# Patient Record
Sex: Male | Born: 1970 | Race: Black or African American | Hispanic: No | Marital: Single | Smoking: Former smoker
Health system: Southern US, Community
[De-identification: ages and names within clinical notes are randomized; demographics above are authoritative.]

## PROBLEM LIST (undated history)

## (undated) DIAGNOSIS — K219 Gastro-esophageal reflux disease without esophagitis: Secondary | ICD-10-CM

## (undated) DIAGNOSIS — Z889 Allergy status to unspecified drugs, medicaments and biological substances status: Secondary | ICD-10-CM

## (undated) DIAGNOSIS — F419 Anxiety disorder, unspecified: Secondary | ICD-10-CM

## (undated) DIAGNOSIS — F329 Major depressive disorder, single episode, unspecified: Secondary | ICD-10-CM

## (undated) DIAGNOSIS — E785 Hyperlipidemia, unspecified: Secondary | ICD-10-CM

## (undated) DIAGNOSIS — F32A Depression, unspecified: Secondary | ICD-10-CM

## (undated) DIAGNOSIS — I10 Essential (primary) hypertension: Secondary | ICD-10-CM

## (undated) HISTORY — DX: Major depressive disorder, single episode, unspecified: F32.9

## (undated) HISTORY — PX: TIBIA FRACTURE SURGERY: SHX806

## (undated) HISTORY — DX: Gastro-esophageal reflux disease without esophagitis: K21.9

## (undated) HISTORY — DX: Hyperlipidemia, unspecified: E78.5

## (undated) HISTORY — DX: Allergy status to unspecified drugs, medicaments and biological substances: Z88.9

## (undated) HISTORY — DX: Essential (primary) hypertension: I10

## (undated) HISTORY — DX: Depression, unspecified: F32.A

## (undated) HISTORY — DX: Anxiety disorder, unspecified: F41.9

---

## 1998-09-19 ENCOUNTER — Encounter: Admission: RE | Admit: 1998-09-19 | Discharge: 1998-09-19 | Payer: Self-pay | Admitting: *Deleted

## 2001-10-23 ENCOUNTER — Encounter: Payer: Self-pay | Admitting: Family Medicine

## 2001-10-23 ENCOUNTER — Ambulatory Visit (HOSPITAL_COMMUNITY): Admission: RE | Admit: 2001-10-23 | Discharge: 2001-10-23 | Payer: Self-pay | Admitting: Family Medicine

## 2002-07-14 ENCOUNTER — Emergency Department (HOSPITAL_COMMUNITY): Admission: EM | Admit: 2002-07-14 | Discharge: 2002-07-14 | Payer: Self-pay | Admitting: Emergency Medicine

## 2002-07-18 ENCOUNTER — Emergency Department (HOSPITAL_COMMUNITY): Admission: EM | Admit: 2002-07-18 | Discharge: 2002-07-18 | Payer: Self-pay | Admitting: Emergency Medicine

## 2003-12-28 ENCOUNTER — Inpatient Hospital Stay (HOSPITAL_COMMUNITY): Admission: AC | Admit: 2003-12-28 | Discharge: 2004-01-03 | Payer: Self-pay

## 2004-01-04 ENCOUNTER — Emergency Department (HOSPITAL_COMMUNITY): Admission: EM | Admit: 2004-01-04 | Discharge: 2004-01-05 | Payer: Self-pay | Admitting: Emergency Medicine

## 2004-02-27 ENCOUNTER — Encounter: Admission: RE | Admit: 2004-02-27 | Discharge: 2004-05-27 | Payer: Self-pay | Admitting: *Deleted

## 2004-05-29 ENCOUNTER — Encounter: Admission: RE | Admit: 2004-05-29 | Discharge: 2004-05-29 | Payer: Self-pay | Admitting: Neurology

## 2004-08-13 ENCOUNTER — Emergency Department (HOSPITAL_COMMUNITY): Admission: EM | Admit: 2004-08-13 | Discharge: 2004-08-13 | Payer: Self-pay | Admitting: Emergency Medicine

## 2011-07-21 ENCOUNTER — Other Ambulatory Visit: Payer: Self-pay | Admitting: Orthopedic Surgery

## 2011-07-21 ENCOUNTER — Encounter (HOSPITAL_COMMUNITY): Payer: 59

## 2011-07-21 LAB — URINALYSIS, ROUTINE W REFLEX MICROSCOPIC
Bilirubin Urine: NEGATIVE
Glucose, UA: NEGATIVE mg/dL
Hgb urine dipstick: NEGATIVE
Ketones, ur: NEGATIVE mg/dL
Leukocytes, UA: NEGATIVE
Nitrite: NEGATIVE
Protein, ur: NEGATIVE mg/dL
Specific Gravity, Urine: 1.024 (ref 1.005–1.030)
Urobilinogen, UA: 1 mg/dL (ref 0.0–1.0)
pH: 5.5 (ref 5.0–8.0)

## 2011-07-21 LAB — CBC
HCT: 43 % (ref 39.0–52.0)
Hemoglobin: 14.5 g/dL (ref 13.0–17.0)
MCH: 29.5 pg (ref 26.0–34.0)
MCHC: 33.7 g/dL (ref 30.0–36.0)
MCV: 87.6 fL (ref 78.0–100.0)
Platelets: 275 10*3/uL (ref 150–400)
RBC: 4.91 MIL/uL (ref 4.22–5.81)
RDW: 13.4 % (ref 11.5–15.5)
WBC: 5.4 10*3/uL (ref 4.0–10.5)

## 2011-07-21 LAB — PROTIME-INR
INR: 0.99 (ref 0.00–1.49)
Prothrombin Time: 13.3 seconds (ref 11.6–15.2)

## 2011-07-21 LAB — BASIC METABOLIC PANEL
BUN: 11 mg/dL (ref 6–23)
Calcium: 10 mg/dL (ref 8.4–10.5)
Chloride: 107 mEq/L (ref 96–112)
Creatinine, Ser: 0.83 mg/dL (ref 0.50–1.35)
GFR calc Af Amer: >60 mL/min (ref >60)
GFR calc non Af Amer: >60 mL/min (ref >60)
Glucose, Bld: 83 mg/dL (ref 70–99)
Potassium: 3.7 mEq/L (ref 3.5–5.1)
Sodium: 143 mEq/L (ref 135–145)

## 2011-07-21 LAB — DIFFERENTIAL
Basophils Absolute: 0 10*3/uL (ref 0.0–0.1)
Basophils Relative: 1 % (ref 0–1)
Eosinophils Absolute: 0.1 10*3/uL (ref 0.0–0.7)
Eosinophils Relative: 2 % (ref 0–5)
Lymphocytes Relative: 41 % (ref 12–46)
Lymphs Abs: 2.2 10*3/uL (ref 0.7–4.0)
Monocytes Absolute: 0.7 10*3/uL (ref 0.1–1.0)
Monocytes Relative: 12 % (ref 3–12)
Neutro Abs: 2.4 10*3/uL (ref 1.7–7.7)
Neutrophils Relative %: 44 % (ref 43–77)

## 2011-07-21 LAB — SURGICAL PCR SCREEN
MRSA, PCR: INVALID — AB
Staphylococcus aureus: INVALID — AB

## 2011-07-21 LAB — APTT: aPTT: 27 seconds (ref 24–37)

## 2011-07-24 LAB — MRSA CULTURE

## 2011-07-26 ENCOUNTER — Inpatient Hospital Stay (HOSPITAL_COMMUNITY): Payer: 59

## 2011-07-26 ENCOUNTER — Observation Stay (HOSPITAL_COMMUNITY)
Admission: RE | Admit: 2011-07-26 | Discharge: 2011-07-27 | Disposition: A | Discharge status: Home / Self Care | Payer: 59 | Source: Ambulatory Visit | Attending: Orthopedic Surgery | Admitting: Orthopedic Surgery

## 2011-07-26 DIAGNOSIS — Y838 Other surgical procedures as the cause of abnormal reaction of the patient, or of later complication, without mention of misadventure at the time of the procedure: Secondary | ICD-10-CM | POA: Insufficient documentation

## 2011-07-26 DIAGNOSIS — T8489XA Other specified complication of internal orthopedic prosthetic devices, implants and grafts, initial encounter: Principal | ICD-10-CM | POA: Insufficient documentation

## 2011-07-26 DIAGNOSIS — Z01812 Encounter for preprocedural laboratory examination: Secondary | ICD-10-CM | POA: Insufficient documentation

## 2011-07-26 DIAGNOSIS — M25569 Pain in unspecified knee: Secondary | ICD-10-CM | POA: Insufficient documentation

## 2011-07-26 DIAGNOSIS — E785 Hyperlipidemia, unspecified: Secondary | ICD-10-CM | POA: Insufficient documentation

## 2011-07-26 DIAGNOSIS — Z79899 Other long term (current) drug therapy: Secondary | ICD-10-CM | POA: Insufficient documentation

## 2011-07-26 DIAGNOSIS — K219 Gastro-esophageal reflux disease without esophagitis: Secondary | ICD-10-CM | POA: Insufficient documentation
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
## 2011-11-03 ENCOUNTER — Other Ambulatory Visit: Payer: Self-pay | Admitting: Family Medicine

## 2011-11-03 DIAGNOSIS — R51 Headache: Secondary | ICD-10-CM

## 2011-11-08 ENCOUNTER — Encounter: Payer: Self-pay | Admitting: Neurology

## 2011-11-09 ENCOUNTER — Ambulatory Visit
Admission: RE | Admit: 2011-11-09 | Discharge: 2011-11-09 | Disposition: A | Discharge status: Home / Self Care | Payer: 59 | Source: Ambulatory Visit | Attending: Family Medicine | Admitting: Family Medicine

## 2011-11-09 DIAGNOSIS — R51 Headache: Secondary | ICD-10-CM

## 2011-12-01 ENCOUNTER — Ambulatory Visit (INDEPENDENT_AMBULATORY_CARE_PROVIDER_SITE_OTHER): Payer: 59 | Admitting: Neurology

## 2011-12-01 ENCOUNTER — Encounter: Payer: Self-pay | Admitting: Neurology

## 2011-12-01 VITALS — BP 122/80 | HR 60 | Wt 200.0 lb

## 2011-12-01 DIAGNOSIS — G43919 Migraine, unspecified, intractable, without status migrainosus: Secondary | ICD-10-CM

## 2011-12-01 MED ORDER — TOPIRAMATE 100 MG PO TABS: ORAL_TABLET | ORAL | Status: DC

## 2011-12-01 MED ORDER — RIZATRIPTAN BENZOATE 10 MG PO TABS: 10.0000 mg | ORAL_TABLET | Freq: Once | ORAL | Status: DC | PRN

## 2011-12-01 MED ORDER — DEXAMETHASONE 2 MG PO TABS: ORAL_TABLET | ORAL | Status: DC
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
## 2011-12-01 NOTE — Patient Instructions (Signed)
Your prescription has been sent to your pharmacy.  We will see you back in 6 weeks.

## 2012-01-12 ENCOUNTER — Ambulatory Visit: Payer: 59 | Admitting: Neurology

## 2013-11-05 ENCOUNTER — Other Ambulatory Visit: Payer: Self-pay | Admitting: Orthopedic Surgery

## 2013-11-14 ENCOUNTER — Encounter (HOSPITAL_BASED_OUTPATIENT_CLINIC_OR_DEPARTMENT_OTHER): Payer: Self-pay | Admitting: *Deleted

## 2013-11-19 ENCOUNTER — Encounter (HOSPITAL_BASED_OUTPATIENT_CLINIC_OR_DEPARTMENT_OTHER): Payer: Self-pay | Admitting: Anesthesiology

## 2013-11-19 ENCOUNTER — Encounter (HOSPITAL_BASED_OUTPATIENT_CLINIC_OR_DEPARTMENT_OTHER): Payer: 59 | Admitting: Anesthesiology

## 2013-11-19 ENCOUNTER — Encounter (HOSPITAL_BASED_OUTPATIENT_CLINIC_OR_DEPARTMENT_OTHER)
Admission: RE | Disposition: A | Discharge status: Home / Self Care | Payer: Self-pay | Source: Ambulatory Visit | Attending: Orthopedic Surgery

## 2013-11-19 ENCOUNTER — Ambulatory Visit (HOSPITAL_BASED_OUTPATIENT_CLINIC_OR_DEPARTMENT_OTHER)
Admission: RE | Admit: 2013-11-19 | Discharge: 2013-11-19 | Disposition: A | Discharge status: Home / Self Care | Payer: 59 | Source: Ambulatory Visit | Attending: Orthopedic Surgery | Admitting: Orthopedic Surgery

## 2013-11-19 ENCOUNTER — Ambulatory Visit (HOSPITAL_BASED_OUTPATIENT_CLINIC_OR_DEPARTMENT_OTHER): Payer: 59 | Admitting: Anesthesiology

## 2013-11-19 DIAGNOSIS — Z87891 Personal history of nicotine dependence: Secondary | ICD-10-CM | POA: Insufficient documentation

## 2013-11-19 DIAGNOSIS — M75101 Unspecified rotator cuff tear or rupture of right shoulder, not specified as traumatic: Secondary | ICD-10-CM

## 2013-11-19 DIAGNOSIS — M67919 Unspecified disorder of synovium and tendon, unspecified shoulder: Secondary | ICD-10-CM | POA: Insufficient documentation

## 2013-11-19 DIAGNOSIS — E785 Hyperlipidemia, unspecified: Secondary | ICD-10-CM | POA: Insufficient documentation

## 2013-11-19 DIAGNOSIS — F411 Generalized anxiety disorder: Secondary | ICD-10-CM | POA: Insufficient documentation

## 2013-11-19 DIAGNOSIS — F329 Major depressive disorder, single episode, unspecified: Secondary | ICD-10-CM | POA: Insufficient documentation

## 2013-11-19 DIAGNOSIS — I10 Essential (primary) hypertension: Secondary | ICD-10-CM | POA: Insufficient documentation

## 2013-11-19 DIAGNOSIS — M24119 Other articular cartilage disorders, unspecified shoulder: Secondary | ICD-10-CM | POA: Insufficient documentation

## 2013-11-19 DIAGNOSIS — M719 Bursopathy, unspecified: Secondary | ICD-10-CM | POA: Insufficient documentation

## 2013-11-19 DIAGNOSIS — M25819 Other specified joint disorders, unspecified shoulder: Secondary | ICD-10-CM | POA: Insufficient documentation

## 2013-11-19 DIAGNOSIS — F3289 Other specified depressive episodes: Secondary | ICD-10-CM | POA: Insufficient documentation

## 2013-11-19 DIAGNOSIS — K219 Gastro-esophageal reflux disease without esophagitis: Secondary | ICD-10-CM | POA: Insufficient documentation

## 2013-11-19 DIAGNOSIS — Z79899 Other long term (current) drug therapy: Secondary | ICD-10-CM | POA: Insufficient documentation

## 2013-11-19 HISTORY — PX: SHOULDER ARTHROSCOPY WITH ROTATOR CUFF REPAIR AND SUBACROMIAL DECOMPRESSION: SHX5686

## 2013-11-19 LAB — POCT HEMOGLOBIN-HEMACUE: Hemoglobin: 15.5 g/dL (ref 13.0–17.0)

## 2013-11-19 SURGERY — SHOULDER ARTHROSCOPY WITH ROTATOR CUFF REPAIR AND SUBACROMIAL DECOMPRESSION
Anesthesia: Regional | Site: Shoulder | Laterality: Right

## 2013-11-19 MED ORDER — BUPIVACAINE-EPINEPHRINE PF 0.5-1:200000 % IJ SOLN
INTRAMUSCULAR | Status: DC | PRN
  Administered 2013-11-19: 30 mL via PERINEURAL

## 2013-11-19 MED ORDER — POVIDONE-IODINE 7.5 % EX SOLN: Freq: Once | CUTANEOUS | Status: DC

## 2013-11-19 MED ORDER — CEFAZOLIN SODIUM-DEXTROSE 2-3 GM-% IV SOLR
2.0000 g | INTRAVENOUS | Status: AC
  Administered 2013-11-19: 2 g via INTRAVENOUS

## 2013-11-19 MED ORDER — DOCUSATE SODIUM 100 MG PO CAPS: 100.0000 mg | ORAL_CAPSULE | Freq: Three times a day (TID) | ORAL | Status: DC | PRN

## 2013-11-19 MED ORDER — ONDANSETRON HCL 4 MG/2ML IJ SOLN
INTRAMUSCULAR | Status: DC | PRN
  Administered 2013-11-19: 4 mg via INTRAVENOUS

## 2013-11-19 MED ORDER — FENTANYL CITRATE 0.05 MG/ML IJ SOLN
50.0000 ug | INTRAMUSCULAR | Status: DC | PRN
  Administered 2013-11-19: 100 ug via INTRAVENOUS

## 2013-11-19 MED ORDER — LACTATED RINGERS IV SOLN
INTRAVENOUS | Status: DC
  Administered 2013-11-19 (×2): via INTRAVENOUS

## 2013-11-19 MED ORDER — MIDAZOLAM HCL 2 MG/2ML IJ SOLN
1.0000 mg | INTRAMUSCULAR | Status: DC | PRN
  Administered 2013-11-19: 2 mg via INTRAVENOUS

## 2013-11-19 MED ORDER — OXYCODONE-ACETAMINOPHEN 5-325 MG PO TABS: 1.0000 | ORAL_TABLET | ORAL | Status: DC | PRN

## 2013-11-19 MED ORDER — OXYCODONE HCL 5 MG/5ML PO SOLN: 5.0000 mg | Freq: Once | ORAL | Status: DC | PRN

## 2013-11-19 MED ORDER — EPHEDRINE SULFATE 50 MG/ML IJ SOLN
INTRAMUSCULAR | Status: DC | PRN
  Administered 2013-11-19 (×3): 10 mg via INTRAVENOUS

## 2013-11-19 MED ORDER — HYDROMORPHONE HCL PF 1 MG/ML IJ SOLN: 0.2500 mg | INTRAMUSCULAR | Status: DC | PRN

## 2013-11-19 MED ORDER — LIDOCAINE HCL (CARDIAC) 20 MG/ML IV SOLN
INTRAVENOUS | Status: DC | PRN
  Administered 2013-11-19: 40 mg via INTRAVENOUS

## 2013-11-19 MED ORDER — MIDAZOLAM HCL 2 MG/2ML IJ SOLN
INTRAMUSCULAR | Status: AC
  Filled 2013-11-19: qty 2

## 2013-11-19 MED ORDER — FENTANYL CITRATE 0.05 MG/ML IJ SOLN
INTRAMUSCULAR | Status: AC
  Filled 2013-11-19: qty 2

## 2013-11-19 MED ORDER — LIDOCAINE HCL 4 % MT SOLN
OROMUCOSAL | Status: DC | PRN
  Administered 2013-11-19: 2 mL via TOPICAL

## 2013-11-19 MED ORDER — DEXAMETHASONE SODIUM PHOSPHATE 4 MG/ML IJ SOLN
INTRAMUSCULAR | Status: DC | PRN
  Administered 2013-11-19: 10 mg via INTRAVENOUS

## 2013-11-19 MED ORDER — SUCCINYLCHOLINE CHLORIDE 20 MG/ML IJ SOLN
INTRAMUSCULAR | Status: DC | PRN
  Administered 2013-11-19: 100 mg via INTRAVENOUS

## 2013-11-19 MED ORDER — OXYCODONE HCL 5 MG PO TABS: 5.0000 mg | ORAL_TABLET | Freq: Once | ORAL | Status: DC | PRN

## 2013-11-19 MED ORDER — PROPOFOL 10 MG/ML IV BOLUS
INTRAVENOUS | Status: DC | PRN
  Administered 2013-11-19: 150 mg via INTRAVENOUS
  Administered 2013-11-19: 50 mg via INTRAVENOUS

## 2013-11-19 MED ORDER — CEFAZOLIN SODIUM-DEXTROSE 2-3 GM-% IV SOLR
INTRAVENOUS | Status: AC
  Filled 2013-11-19: qty 50

## 2013-11-19 SURGICAL SUPPLY — 87 items
ADH SKN CLS APL DERMABOND .7 (GAUZE/BANDAGES/DRESSINGS)
ANCH SUT 2 FT CRKSW 14.7X5.5 (Anchor) ×1 IMPLANT
ANCH SUT PUSHLCK 24X4.5 STRL (Orthopedic Implant) ×1 IMPLANT
ANCHOR CORKSCREW FIBER 5.5X15 (Anchor) ×1 IMPLANT
APL SKNCLS STERI-STRIP NONHPOA (GAUZE/BANDAGES/DRESSINGS)
BENZOIN TINCTURE PRP APPL 2/3 (GAUZE/BANDAGES/DRESSINGS) IMPLANT
BLADE SURG 15 STRL LF DISP TIS (BLADE) IMPLANT
BLADE SURG 15 STRL SS (BLADE)
BLADE SURG ROTATE 9660 (MISCELLANEOUS) IMPLANT
BUR OVAL 4.0 (BURR) IMPLANT
BUR OVAL 6.0 (BURR) ×2 IMPLANT
CANISTER SUCT 3000ML (MISCELLANEOUS) IMPLANT
CANISTER SUCT LVC 12 LTR MEDI- (MISCELLANEOUS) ×2 IMPLANT
CANNULA 5.75X71 LONG (CANNULA) ×2 IMPLANT
CANNULA TWIST IN 8.25X7CM (CANNULA) ×1 IMPLANT
CHLORAPREP W/TINT 26ML (MISCELLANEOUS) ×2 IMPLANT
DECANTER SPIKE VIAL GLASS SM (MISCELLANEOUS) IMPLANT
DERMABOND ADVANCED (GAUZE/BANDAGES/DRESSINGS)
DERMABOND ADVANCED .7 DNX12 (GAUZE/BANDAGES/DRESSINGS) IMPLANT
DRAPE INCISE IOBAN 66X45 STRL (DRAPES) ×2 IMPLANT
DRAPE STERI 35X30 U-POUCH (DRAPES) ×2 IMPLANT
DRAPE SURG 17X23 STRL (DRAPES) ×2 IMPLANT
DRAPE U 20/CS (DRAPES) ×2 IMPLANT
DRAPE U-SHAPE 47X51 STRL (DRAPES) ×2 IMPLANT
DRAPE U-SHAPE 76X120 STRL (DRAPES) ×4 IMPLANT
ELECT REM PT RETURN 9FT ADLT (ELECTROSURGICAL) ×2
ELECTRODE REM PT RTRN 9FT ADLT (ELECTROSURGICAL) ×1 IMPLANT
GAUZE SPONGE 4X4 16PLY XRAY LF (GAUZE/BANDAGES/DRESSINGS) IMPLANT
GAUZE XEROFORM 1X8 LF (GAUZE/BANDAGES/DRESSINGS) ×2 IMPLANT
GLOVE BIO SURGEON STRL SZ 6.5 (GLOVE) ×1 IMPLANT
GLOVE BIO SURGEON STRL SZ7 (GLOVE) ×2 IMPLANT
GLOVE BIO SURGEON STRL SZ7.5 (GLOVE) ×2 IMPLANT
GLOVE BIOGEL PI IND STRL 7.0 (GLOVE) ×2 IMPLANT
GLOVE BIOGEL PI IND STRL 8 (GLOVE) ×1 IMPLANT
GLOVE BIOGEL PI INDICATOR 7.0 (GLOVE) ×2
GLOVE BIOGEL PI INDICATOR 8 (GLOVE) ×1
GOWN PREVENTION PLUS XLARGE (GOWN DISPOSABLE) ×4 IMPLANT
GOWN PREVENTION PLUS XXLARGE (GOWN DISPOSABLE) ×2 IMPLANT
NDL 1/2 CIR CATGUT .05X1.09 (NEEDLE) IMPLANT
NDL SCORPION MULTI FIRE (NEEDLE) IMPLANT
NDL SUT 6 .5 CRC .975X.05 MAYO (NEEDLE) IMPLANT
NEEDLE 1/2 CIR CATGUT .05X1.09 (NEEDLE) IMPLANT
NEEDLE MAYO TAPER (NEEDLE)
NEEDLE SCORPION MULTI FIRE (NEEDLE) ×2 IMPLANT
NS IRRIG 1000ML POUR BTL (IV SOLUTION) IMPLANT
PACK ARTHROSCOPY DSU (CUSTOM PROCEDURE TRAY) ×2 IMPLANT
PACK BASIN DAY SURGERY FS (CUSTOM PROCEDURE TRAY) ×2 IMPLANT
PAD ABD 8X10 STRL (GAUZE/BANDAGES/DRESSINGS) ×2 IMPLANT
PENCIL BUTTON HOLSTER BLD 10FT (ELECTRODE) IMPLANT
PUSHLOCK PEEK 4.5X24 (Orthopedic Implant) ×1 IMPLANT
RESECTOR FULL RADIUS 4.2MM (BLADE) ×2 IMPLANT
SHEET MEDIUM DRAPE 40X70 STRL (DRAPES) ×2 IMPLANT
SLEEVE SCD COMPRESS KNEE MED (MISCELLANEOUS) ×2 IMPLANT
SLING ARM FOAM STRAP LRG (SOFTGOODS) IMPLANT
SLING ARM FOAM STRAP MED (SOFTGOODS) IMPLANT
SLING ARM FOAM STRAP XLG (SOFTGOODS) IMPLANT
SLING ARM IMMOBILIZER MED (SOFTGOODS) IMPLANT
SPONGE GAUZE 4X4 12PLY (GAUZE/BANDAGES/DRESSINGS) ×2 IMPLANT
SPONGE LAP 4X18 X RAY DECT (DISPOSABLE) IMPLANT
STRIP CLOSURE SKIN 1/2X4 (GAUZE/BANDAGES/DRESSINGS) IMPLANT
SUCTION FRAZIER TIP 10 FR DISP (SUCTIONS) IMPLANT
SUPPORT WRAP ARM LG (MISCELLANEOUS) IMPLANT
SUT BONE WAX W31G (SUTURE) IMPLANT
SUT ETHIBOND 2 OS 4 DA (SUTURE) IMPLANT
SUT ETHILON 3 0 PS 1 (SUTURE) ×2 IMPLANT
SUT ETHILON 4 0 PS 2 18 (SUTURE) IMPLANT
SUT FIBERWIRE #2 38 T-5 BLUE (SUTURE)
SUT FIBERWIRE 2-0 18 17.9 3/8 (SUTURE)
SUT MNCRL AB 3-0 PS2 18 (SUTURE) IMPLANT
SUT MNCRL AB 4-0 PS2 18 (SUTURE) IMPLANT
SUT PDS AB 0 CT 36 (SUTURE) ×2 IMPLANT
SUT PROLENE 3 0 PS 2 (SUTURE) IMPLANT
SUT VIC AB 0 CT1 27 (SUTURE)
SUT VIC AB 0 CT1 27XBRD ANBCTR (SUTURE) IMPLANT
SUT VIC AB 2-0 SH 27 (SUTURE)
SUT VIC AB 2-0 SH 27XBRD (SUTURE) IMPLANT
SUTURE FIBERWR #2 38 T-5 BLUE (SUTURE) IMPLANT
SUTURE FIBERWR 2-0 18 17.9 3/8 (SUTURE) IMPLANT
SYR BULB 3OZ (MISCELLANEOUS) IMPLANT
TAPE FIBER 2MM 7IN #2 BLUE (SUTURE) IMPLANT
TOWEL OR 17X24 6PK STRL BLUE (TOWEL DISPOSABLE) ×2 IMPLANT
TOWEL OR NON WOVEN STRL DISP B (DISPOSABLE) ×2 IMPLANT
TUBE CONNECTING 20X1/4 (TUBING) ×2 IMPLANT
TUBING ARTHROSCOPY IRRIG 16FT (MISCELLANEOUS) ×2 IMPLANT
WAND STAR VAC 90 (SURGICAL WAND) ×2 IMPLANT
WATER STERILE IRR 1000ML POUR (IV SOLUTION) ×2 IMPLANT
YANKAUER SUCT BULB TIP NO VENT (SUCTIONS) ×1 IMPLANT

## 2013-11-19 NOTE — Op Note (Signed)
Procedure(s): RIGHT SHOULDER SCOPE, ROTATOR CUFF DEBRIDEMENT VERSUS REPAIR, DISTAL CLAVICLE EXCISION, SUB ACROMIAL DECOMPRESSION Procedure Note  James Diaz male 42 y.o. 11/19/2013  Procedure(s) and Anesthesia Type: #1 right shoulder arthroscopic rotator cuff repair #2 right shoulder arthroscopic subacromial decompression #3 right shoulder arthroscopic distal clavicle excision #4 right shoulder arthroscopic debridement type I SLAP tear   Surgeon(s) and Role:    * Mable Paris, MD - Primary     Surgeon: Mable Paris   Assistants: Damita Lack PA-C [facility] was present and scrubbed throughout the procedure and was essential in positioning, assisting with the camera and instrumentation,, and closure)  Anesthesia: General endotracheal anesthesia with preoperative interscalene block     Procedure Detail    Estimated Blood Loss: Min         Drains: none  Blood Given: none         Specimens: none        Complications:  * No complications entered in OR log *         Disposition: PACU - hemodynamically stable.         Condition: stable    Procedure:   INDICATIONS FOR SURGERY: The patient is 42 y.o. male who has had a long history of right shoulder pain. He failed conservative management with activity modification, exercise and injection therapy. An MRI revealed high-grade partial-thickness tearing. He was indicated for surgical treatment to try and decrease pain and restore function.  OPERATIVE FINDINGS: Examination under anesthesia: No stiffness or instability Diagnostic Arthroscopy:  Glenoid articular cartilage: Intact Humeral head articular cartilage: Intact Labrum: Type I superior labral tear without significant detachment of the biceps anchor. Loose bodies: None Synovitis: Minimal Articular sided rotator cuff: High-grade partial-thickness tear with the area just posterior to the rotator cuff having approximately 90% articular sided  tear, significant exposure of the tuberosity. Bursal sided rotator cuff: Intact but thin in the area of the high-grade tear. Moderate downsloping anterior acromion and this was acromioplasty. Distal clavicle arthritic, addressed with a standard distal clavicle excision arthroscopically.  DESCRIPTION OF PROCEDURE: The patient was identified in preoperative  holding area where I personally marked the operative site after  verifying site, side, and procedure with the patient. An interscalene block was given by the attending anesthesiologist the holding area.  The patient was taken back to the operating room where general anesthesia was induced without complication and was placed in the beach-chair position with the back  elevated about 60 degrees and all extremities and head and neck carefully padded and  positioned.   The right upper extremity was then prepped and  draped in a standard sterile fashion. The appropriate time-out  procedure was carried out. The patient did receive IV antibiotics  within 30 minutes of incision.   A small posterior portal incision was made and the arthroscope was introduced into the joint. An anterior portal was then established above the subscapularis using needle localization. Small cannula was placed anteriorly. Diagnostic arthroscopy was then carried out with findings as described above.  He was noted to have significant tearing of the superior labrum at the biceps anchor. There was large flaps hanging into the joint surface. These were debrided and the remaining labrum was felt to be largely intact and there is no significant elevation the biceps anchor. The biceps tendon was pulled into the joint and found to be intact.  The predominant finding was the supraspinatus tendon which had significant sided articular sided tearing with severe fraying of the tendon  edge and a large area of exposed tuberosity just posterior to the biceps tendon. This area was debrided. It  was felt that the exposed tuberosity represented at least two thirds of the tendon footprint. Therefore a spinal needle was used to pass a PDS suture through the point of maximal tearing. The arthroscope was then placed in the subacromial space and a bursectomy was performed. The PDS suture was identified. A probe was used through a lateral portal created with needle localization to probe the area. It was felt to be extremely thin. Therefore an 11 blade was used to sharply open the tear and complete the high-grade partial tear for a length of about 1 cm. The tear edge was debrided with shaver and the tuberosity was also debrided. At this point it was noted that the cyst noted on his MRI was visible on the lateral tuberosity in the tear. The shaver was used to debride the contents of the cyst and try and promote healing. The tuberosity was debrided to promote bleeding and healing. A 5.5 mm peak corkscrew anchor was placed just off the articular margin central and the tear. The 4 suture strands were then passed evenly about 1 cm off of the margin of the tear and tied in a horizontal mattress configuration securing the medial row down to the articular margin. Each strand was then brought over into a 4.5 mm peak push lock anchor which was then brought down off the lateral tuberosity to create a lateral row fixation. This brought the tendon nicely down over the prepared tuberosity and also sealed off the entrance to the cyst. The tear was viewed from posterior and lateral and felt to be complete.  The coracoacromial ligament was taken down off the anterior acromion with the ArthroCare exposing a moderate downsloping anterior acromial spur. A high-speed bur was then used through the lateral portal to take down the anterior acromial spur from lateral to medial in a standard acromioplasty.  The acromioplasty was also viewed from the lateral portal and the bur was used as necessary to ensure that the acromion was  completely flat from posterior to anterior.  The distal clavicle was exposed arthroscopically and the bur was used to take off the undersurface for approximately 8 mm from the lateral portal. The bur was then moved to an anterior portal position to complete the distal clavicle excision resecting about 8 mm of the distal clavicle and a smooth even fashion. This was viewed from anterior and lateral portals and felt to be complete.  The arthroscopic equipment was removed from the joint and the portals were closed with 3-0 nylon in an interrupted fashion. Sterile dressings were then applied including Xeroform 4 x 4's ABDs and tape. The patient was then allowed to awaken from general anesthesia, placed in a sling, transferred to the stretcher and taken to the recovery room in stable condition.   POSTOPERATIVE PLAN: The patient will be discharged home today and will followup in one week for suture removal and wound check.  He'll follow the standard cuff protocol.

## 2013-11-19 NOTE — Anesthesia Preprocedure Evaluation (Addendum)
Anesthesia Evaluation  Patient identified by MRN, date of birth, ID band Patient awake    Reviewed: Allergy & Precautions, H&P , NPO status , Patient's Chart, lab work & pertinent test results  Airway Mallampati: II TM Distance: >3 FB Neck ROM: Full    Dental no notable dental hx. (+) Teeth Intact and Dental Advisory Given   Pulmonary neg pulmonary ROS, former smoker,  breath sounds clear to auscultation  Pulmonary exam normal       Cardiovascular hypertension, Rhythm:Regular Rate:Normal     Neuro/Psych Depression negative neurological ROS     GI/Hepatic Neg liver ROS, GERD-  Medicated and Controlled,  Endo/Other  negative endocrine ROS  Renal/GU negative Renal ROS  negative genitourinary   Musculoskeletal   Abdominal   Peds  Hematology negative hematology ROS (+)   Anesthesia Other Findings   Reproductive/Obstetrics negative OB ROS                          Anesthesia Physical Anesthesia Plan  ASA: II  Anesthesia Plan: General and Regional   Post-op Pain Management:    Induction: Intravenous  Airway Management Planned: Oral ETT  Additional Equipment:   Intra-op Plan:   Post-operative Plan: Extubation in OR  Informed Consent: I have reviewed the patients History and Physical, chart, labs and discussed the procedure including the risks, benefits and alternatives for the proposed anesthesia with the patient or authorized representative who has indicated his/her understanding and acceptance.   Dental advisory given  Plan Discussed with: CRNA  Anesthesia Plan Comments:         Anesthesia Quick Evaluation

## 2013-11-19 NOTE — H&P (Signed)
James Diaz is an 42 y.o. male.   Chief Complaint: R shoulder pain HPI: R shoulder pain with impingement partial intrasubstance RCT and AC DJD failed nonop treatment.  Past Medical History  Diagnosis Date  . Depression   . Anxiety   . Hypertension   . Other and unspecified hyperlipidemia   . Acid reflux   . Multiple allergies     Past Surgical History  Procedure Laterality Date  . Tibia fracture surgery      History reviewed. No pertinent family history. Social History:  reports that he quit smoking about 5 years ago. He has never used smokeless tobacco. He reports that he drinks alcohol. He reports that he does not use illicit drugs.  Allergies: No Known Allergies  Medications Prior to Admission  Medication Sig Dispense Refill  . dexlansoprazole (DEXILANT) 60 MG capsule Take 60 mg by mouth daily.        Marland Kitchen topiramate (TOPAMAX) 100 MG tablet take 0.5 tab in the am, 1 tab at night.  45 tablet  3  . rizatriptan (MAXALT) 10 MG tablet Take 1 tablet (10 mg total) by mouth once as needed for migraine. May repeat in 2 hours if needed. Max 8 per month.  8 tablet  2    No results found for this or any previous visit (from the past 48 hour(s)). No results found.  Review of Systems  All other systems reviewed and are negative.    Height 5\' 8"  (1.727 m), weight 90.719 kg (200 lb). Physical Exam  Constitutional: He is oriented to person, place, and time. He appears well-developed and well-nourished.  HENT:  Head: Atraumatic.  Eyes: EOM are normal.  Cardiovascular: Intact distal pulses.   Respiratory: Effort normal.  Musculoskeletal:  R shoulder pain with impingement testing TTP at [facility] and RC insertion.  Neurological: He is alert and oriented to person, place, and time.  Skin: Skin is warm and dry.  Psychiatric: He has a normal mood and affect.     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 Plan R arth  SAD/DCR, RC debride vs repair Risks / benefits of surgery discussed Consent on chart  NPO for OR Preop antibiotics   Emelia Sandoval WILLIAM 11/19/2013, 11:38 AM

## 2013-11-19 NOTE — Transfer of Care (Signed)
Immediate Anesthesia Transfer of Care Note  Patient: James Diaz  Procedure(s) Performed: Procedure(s) with comments: RIGHT SHOULDER SCOPE, ROTATOR CUFF DEBRIDEMENT VERSUS REPAIR, DISTAL CLAVICLE EXCISION, SUB ACROMIAL DECOMPRESSION (Right) - repair  Patient Location: PACU  Anesthesia Type:GA combined with regional for post-op pain  Level of Consciousness: awake, alert  and oriented  Airway & Oxygen Therapy: Patient Spontanous Breathing and Patient connected to face mask oxygen  Post-op Assessment: Report given to PACU RN and Post -op Vital signs reviewed and stable  Post vital signs: Reviewed and stable  Complications: No apparent anesthesia complications

## 2013-11-19 NOTE — Progress Notes (Signed)
Assisted Dr. Fitzgerald with right, ultrasound guided, interscalene  block. Side rails up, monitors on throughout procedure. See vital signs in flow sheet. Tolerated Procedure well. 

## 2013-11-19 NOTE — Anesthesia Postprocedure Evaluation (Signed)
  Anesthesia Post-op Note  Patient: James Diaz  Procedure(s) Performed: Procedure(s) with comments: RIGHT SHOULDER SCOPE, ROTATOR CUFF DEBRIDEMENT VERSUS REPAIR, DISTAL CLAVICLE EXCISION, SUB ACROMIAL DECOMPRESSION (Right) - repair  Patient Location: PACU  Anesthesia Type:General and block  Level of Consciousness: awake and alert   Airway and Oxygen Therapy: Patient Spontanous Breathing  Post-op Pain: none  Post-op Assessment: Post-op Vital signs reviewed, Patient's Cardiovascular Status Stable and Respiratory Function Stable  Post-op Vital Signs: Reviewed  Filed Vitals:   11/19/13 1545  BP:   Pulse: 86  Temp:   Resp: 18    Complications: No apparent anesthesia complications

## 2013-11-19 NOTE — Anesthesia Procedure Notes (Addendum)
Anesthesia Regional Block:  Interscalene brachial plexus block  Pre-Anesthetic Checklist: ,, timeout performed, Correct Patient, Correct Site, Correct Laterality, Correct Procedure, Correct Position, site marked, Risks and benefits discussed, pre-op evaluation,  At surgeon's request and post-op pain management  Laterality: Right  Prep: Maximum Sterile Barrier Precautions used and chloraprep       Needles:  Injection technique: Single-shot  Needle Type: Echogenic Stimulator Needle     Needle Length: 5cm 5 cm Needle Gauge: 22 and 22 G    Additional Needles:  Procedures: ultrasound guided (picture in chart) and nerve stimulator Interscalene brachial plexus block  Nerve Stimulator or Paresthesia:  Response: Biceps response,   Additional Responses:   Narrative:  Start time: 11/19/2013 12:41 PM End time: 11/19/2013 12:49 PM Injection made incrementally with aspirations every 5 mL. Anesthesiologist: Sampson Goon, MD  Additional Notes: 2% Lidocaine skin wheel.    Procedure Name: Intubation Date/Time: 11/19/2013 1:06 PM Performed by: Burna Cash Pre-anesthesia Checklist: Patient identified, Emergency Drugs available, Suction available and Patient being monitored Patient Re-evaluated:Patient Re-evaluated prior to inductionOxygen Delivery Method: Circle System Utilized Preoxygenation: Pre-oxygenation with 100% oxygen Intubation Type: IV induction Ventilation: Mask ventilation without difficulty Laryngoscope Size: Mac, 3 and 4 Grade View: Grade III Tube type: Oral Tube size: 8.0 mm Number of attempts: 1 Airway Equipment and Method: stylet and oral airway Placement Confirmation: ETT inserted through vocal cords under direct vision,  positive ETCO2 and breath sounds checked- equal and bilateral Secured at: 24 cm Tube secured with: Tape Dental Injury: Teeth and Oropharynx as per pre-operative assessment  Comments: DL times 2 due to deep epiglottis. MAC #3 to MAC #4 with  adequate view

## 2013-11-21 ENCOUNTER — Encounter (HOSPITAL_BASED_OUTPATIENT_CLINIC_OR_DEPARTMENT_OTHER): Payer: Self-pay | Admitting: Orthopedic Surgery

## 2014-05-22 ENCOUNTER — Other Ambulatory Visit: Payer: Self-pay | Admitting: Neurological Surgery

## 2014-05-22 ENCOUNTER — Other Ambulatory Visit (HOSPITAL_COMMUNITY): Payer: Self-pay | Admitting: Neurological Surgery

## 2014-05-22 DIAGNOSIS — G96198 Other disorders of meninges, not elsewhere classified: Secondary | ICD-10-CM

## 2014-05-22 DIAGNOSIS — G9619 Other disorders of meninges, not elsewhere classified: Principal | ICD-10-CM

## 2014-06-12 ENCOUNTER — Encounter (HOSPITAL_COMMUNITY): Payer: Self-pay | Admitting: Pharmacy Technician

## 2014-06-13 ENCOUNTER — Encounter (HOSPITAL_COMMUNITY): Payer: Self-pay

## 2014-06-13 ENCOUNTER — Ambulatory Visit (HOSPITAL_COMMUNITY)
Admission: RE | Admit: 2014-06-13 | Discharge: 2014-06-13 | Disposition: A | Discharge status: Home / Self Care | Payer: 59 | Source: Ambulatory Visit | Attending: Neurological Surgery | Admitting: Neurological Surgery

## 2014-06-13 DIAGNOSIS — G9619 Other disorders of meninges, not elsewhere classified: Principal | ICD-10-CM

## 2014-06-13 DIAGNOSIS — M5124 Other intervertebral disc displacement, thoracic region: Secondary | ICD-10-CM | POA: Insufficient documentation

## 2014-06-13 DIAGNOSIS — G96198 Other disorders of meninges, not elsewhere classified: Secondary | ICD-10-CM

## 2014-06-13 DIAGNOSIS — G589 Mononeuropathy, unspecified: Secondary | ICD-10-CM | POA: Insufficient documentation

## 2014-06-13 DIAGNOSIS — J9819 Other pulmonary collapse: Secondary | ICD-10-CM | POA: Insufficient documentation

## 2014-06-13 MED ORDER — ONDANSETRON HCL 4 MG/2ML IJ SOLN: 4.0000 mg | Freq: Four times a day (QID) | INTRAMUSCULAR | Status: DC | PRN

## 2014-06-13 MED ORDER — DIAZEPAM 5 MG PO TABS
10.0000 mg | ORAL_TABLET | Freq: Once | ORAL | Status: AC
Start: 2014-06-13 — End: 2014-06-13
  Administered 2014-06-13: 10 mg via ORAL

## 2014-06-13 MED ORDER — DIAZEPAM 5 MG PO TABS
ORAL_TABLET | ORAL | Status: AC
  Administered 2014-06-13: 10 mg via ORAL
  Filled 2014-06-13: qty 2

## 2014-06-13 MED ORDER — IOHEXOL 300 MG/ML  SOLN
10.0000 mL | Freq: Once | INTRAMUSCULAR | Status: AC | PRN
  Administered 2014-06-13: 10 mL via INTRATHECAL

## 2014-06-13 MED ORDER — HYDROCODONE-ACETAMINOPHEN 5-325 MG PO TABS: 1.0000 | ORAL_TABLET | ORAL | Status: DC | PRN
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
## 2014-06-13 NOTE — Discharge Instructions (Signed)
Myelography °Care After °These instructions give you information on caring for yourself after your procedure. Your doctor may also give you specific instructions. Call your doctor if you have any problems or questions after your procedure. °HOME CARE °· Rest often the first day. °· When you rest, lie flat, with your head slightly raised (elevated). °· Avoid heavy lifting and activity for 48 hours. °· You may take the bandage (dressing) off 1 day after the test. °GET HELP RIGHT AWAY IF:  °· You have a very bad headache. °· You have a fever. °MAKE SURE YOU: °· Understand these instructions. °· Will watch your condition. °· Will get help right away if you are not doing well or get worse. °Document Released: 09/07/2008 Document Revised: 11/15/2012 Document Reviewed: 08/23/2012 °Exit[employer]® Patient Information ©2015 [employer]. This information is not intended to replace advice given to you by your health care provider. Make sure you discuss any questions you have with your health care provider. ° ° °

## 2014-07-02 ENCOUNTER — Other Ambulatory Visit: Payer: Self-pay | Admitting: Neurological Surgery

## 2014-07-02 DIAGNOSIS — G96198 Other disorders of meninges, not elsewhere classified: Secondary | ICD-10-CM

## 2014-07-02 DIAGNOSIS — G9619 Other disorders of meninges, not elsewhere classified: Principal | ICD-10-CM

## 2014-08-13 ENCOUNTER — Other Ambulatory Visit: Payer: 59

## 2014-08-13 ENCOUNTER — Inpatient Hospital Stay
Admission: RE | Admit: 2014-08-13 | Discharge: 2014-08-13 | Disposition: A | Discharge status: Home / Self Care | Payer: 59 | Source: Ambulatory Visit | Attending: Neurological Surgery | Admitting: Neurological Surgery

## 2014-08-13 NOTE — Discharge Instructions (Signed)

## 2014-09-09 ENCOUNTER — Ambulatory Visit
Admission: RE | Admit: 2014-09-09 | Discharge: 2014-09-09 | Disposition: A | Discharge status: Home / Self Care | Payer: 59 | Source: Ambulatory Visit | Attending: Neurological Surgery | Admitting: Neurological Surgery

## 2014-09-09 VITALS — BP 143/100 | HR 62

## 2014-09-09 DIAGNOSIS — G96198 Other disorders of meninges, not elsewhere classified: Secondary | ICD-10-CM

## 2014-09-09 DIAGNOSIS — G9619 Other disorders of meninges, not elsewhere classified: Principal | ICD-10-CM

## 2014-09-09 MED ORDER — IOHEXOL 300 MG/ML  SOLN
10.0000 mL | Freq: Once | INTRAMUSCULAR | Status: AC | PRN
  Administered 2014-09-09: 10 mL via INTRATHECAL

## 2014-09-09 MED ORDER — DIAZEPAM 5 MG PO TABS
10.0000 mg | ORAL_TABLET | Freq: Once | ORAL | Status: AC
  Administered 2014-09-09: 10 mg via ORAL

## 2014-09-09 NOTE — Discharge Instructions (Signed)

## 2017-04-20 ENCOUNTER — Emergency Department (HOSPITAL_COMMUNITY): Payer: 59

## 2017-04-20 ENCOUNTER — Emergency Department (HOSPITAL_COMMUNITY)
Admission: EM | Admit: 2017-04-20 | Discharge: 2017-04-21 | Disposition: A | Discharge status: Home / Self Care | Payer: 59 | Attending: Emergency Medicine | Admitting: Emergency Medicine

## 2017-04-20 ENCOUNTER — Encounter (HOSPITAL_COMMUNITY): Payer: Self-pay | Admitting: *Deleted

## 2017-04-20 DIAGNOSIS — M542 Cervicalgia: Secondary | ICD-10-CM | POA: Insufficient documentation

## 2017-04-20 DIAGNOSIS — Z87891 Personal history of nicotine dependence: Secondary | ICD-10-CM | POA: Diagnosis not present

## 2017-04-20 DIAGNOSIS — R51 Headache: Secondary | ICD-10-CM | POA: Diagnosis present

## 2017-04-20 DIAGNOSIS — Z79899 Other long term (current) drug therapy: Secondary | ICD-10-CM | POA: Insufficient documentation

## 2017-04-20 DIAGNOSIS — I10 Essential (primary) hypertension: Secondary | ICD-10-CM | POA: Insufficient documentation

## 2017-04-20 DIAGNOSIS — R52 Pain, unspecified: Secondary | ICD-10-CM

## 2017-04-20 LAB — BASIC METABOLIC PANEL
Anion gap: 7 (ref 5–15)
BUN: 12 mg/dL (ref 6–20)
CO2: 29 mmol/L (ref 22–32)
CREATININE: 0.76 mg/dL (ref 0.61–1.24)
Calcium: 9.2 mg/dL (ref 8.9–10.3)
Chloride: 103 mmol/L (ref 101–111)
GFR calc Af Amer: >60 mL/min (ref >60)
GFR calc non Af Amer: >60 mL/min (ref >60)
Glucose, Bld: 114 mg/dL — ABNORMAL HIGH (ref 65–99)
Potassium: 3.5 mmol/L (ref 3.5–5.1)
Sodium: 139 mmol/L (ref 135–145)

## 2017-04-20 LAB — CBC WITH DIFFERENTIAL/PLATELET
BASOS ABS: 0.1 10*3/uL (ref 0.0–0.1)
BASOS PCT: 1 %
EOS ABS: 0.2 10*3/uL (ref 0.0–0.7)
Eosinophils Relative: 3 %
HCT: 45.2 % (ref 39.0–52.0)
Hemoglobin: 15.4 g/dL (ref 13.0–17.0)
LYMPHS PCT: 47 %
Lymphs Abs: 3 10*3/uL (ref 0.7–4.0)
MCH: 30.1 pg (ref 26.0–34.0)
MCHC: 34.1 g/dL (ref 30.0–36.0)
MCV: 88.5 fL (ref 78.0–100.0)
MONO ABS: 0.6 10*3/uL (ref 0.1–1.0)
Monocytes Relative: 9 %
Neutro Abs: 2.6 10*3/uL (ref 1.7–7.7)
Neutrophils Relative %: 40 %
PLATELETS: 292 10*3/uL (ref 150–400)
RBC: 5.11 MIL/uL (ref 4.22–5.81)
RDW: 12.9 % (ref 11.5–15.5)
WBC: 6.4 10*3/uL (ref 4.0–10.5)

## 2017-04-20 MED ORDER — SODIUM CHLORIDE 0.9 % IV SOLN
INTRAVENOUS | Status: DC
  Administered 2017-04-20: 22:00:00 via INTRAVENOUS

## 2017-04-20 MED ORDER — MORPHINE SULFATE (PF) 4 MG/ML IV SOLN
4.0000 mg | Freq: Once | INTRAVENOUS | Status: AC
  Administered 2017-04-20: 4 mg via INTRAVENOUS
  Filled 2017-04-20: qty 1

## 2017-04-20 MED ORDER — OXYCODONE-ACETAMINOPHEN 5-325 MG PO TABS: 2.0000 | ORAL_TABLET | ORAL | 0 refills | Status: DC | PRN

## 2017-04-20 MED ORDER — METHOCARBAMOL 750 MG PO TABS: 750.0000 mg | ORAL_TABLET | Freq: Four times a day (QID) | ORAL | 0 refills | Status: DC

## 2017-04-20 MED ORDER — GADOBENATE DIMEGLUMINE 529 MG/ML IV SOLN
18.0000 mL | Freq: Once | INTRAVENOUS | Status: AC | PRN
  Administered 2017-04-20: 18 mL via INTRAVENOUS
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
## 2017-04-20 NOTE — Discharge Instructions (Addendum)
Your MRI today showed no acute CSF leak. Contact the neurosurgeon to schedule a follow-up visit

## 2017-04-20 NOTE — ED Provider Notes (Signed)
[facility]-EMERGENCY DEPT Provider Note   CSN: [id number] Arrival date & time: 04/20/17  2011     History   Chief Complaint Chief Complaint  Patient presents with  . Neck Pain    HPI James Diaz is a 46 y.o. male.  46 year old male presents with several day history of right-sided head pain which radiates to his right arm. History of similar symptoms associated with spontaneous spinal fluid leak in a cervical disc. Denies any fever or chills. No photophobia. Denies any meningeal signs. No recent history of trauma. No wrist drop noted. Symptoms are worse with activity and better with remaining still. Use over-the-counter medications without relief. Was treated last time for this with a blood patch.      Past Medical History:  Diagnosis Date  . Acid reflux   . Anxiety   . Depression   . Hypertension   . Multiple allergies   . Other and unspecified hyperlipidemia     There are no active problems to display for this patient.   Past Surgical History:  Procedure Laterality Date  . SHOULDER ARTHROSCOPY WITH ROTATOR CUFF REPAIR AND SUBACROMIAL DECOMPRESSION Right 11/19/2013   Procedure: RIGHT SHOULDER SCOPE, ROTATOR CUFF DEBRIDEMENT VERSUS REPAIR, DISTAL CLAVICLE EXCISION, SUB ACROMIAL DECOMPRESSION;  Surgeon: Mable ParisJustin William Chandler, MD;  Location: [facility];  Service: Orthopedics;  Laterality: Right;  repair  . TIBIA FRACTURE SURGERY         Home Medications    Prior to Admission medications   Medication Sig Start Date End Date Taking? Authorizing Provider  dexlansoprazole (DEXILANT) 60 MG capsule Take 60 mg by mouth daily.     Yes [provider]  naproxen sodium (ANAPROX) 220 MG tablet Take 440 mg by mouth 2 (two) times daily with a meal.   Yes [provider]  rizatriptan (MAXALT) 10 MG tablet Take 1 tablet (10 mg total) by mouth once as needed for migraine. May repeat in 2 hours if needed. Max 8 per month. Patient not taking: Reported on  04/20/2017 12/01/11 11/30/12  Milas GainWong, Matthew H, MD    Family History No family history on file.  Social History Social History  Substance Use Topics  . Smoking status: Former Smoker    Quit date: 12/01/2007  . Smokeless tobacco: Never Used  . Alcohol use Yes     Comment: weekends     Allergies   Patient has no known allergies.   Review of Systems Review of Systems  All other systems reviewed and are negative.    Physical Exam Updated Vital Signs BP (!) 151/105 (BP Location: Right Arm)   Pulse 82   Temp 98 F (36.7 C) (Oral)   Resp 18   SpO2 100%   Physical Exam  Constitutional: He is oriented to person, place, and time. He appears well-developed and well-nourished.  Non-toxic appearance. No distress.  HENT:  Head: Normocephalic and atraumatic.  Eyes: Conjunctivae, EOM and lids are normal. Pupils are equal, round, and reactive to light.  Neck: Normal range of motion. Neck supple. No tracheal deviation present. No thyroid mass present.    Cardiovascular: Normal rate, regular rhythm and normal heart sounds.  Exam reveals no gallop.   No murmur heard. Pulmonary/Chest: Effort normal and breath sounds normal. No stridor. No respiratory distress. He has no decreased breath sounds. He has no wheezes. He has no rhonchi. He has no rales.  Abdominal: Soft. Normal appearance and bowel sounds are normal. He exhibits no distension. There is no  tenderness. There is no rebound and no CVA tenderness.  Musculoskeletal: Normal range of motion. He exhibits no edema or tenderness.  Neurological: He is alert and oriented to person, place, and time. He has normal strength. No cranial nerve deficit or sensory deficit. GCS eye subscore is 4. GCS verbal subscore is 5. GCS motor subscore is 6.  Skin: Skin is warm and dry. No abrasion and no rash noted.  Psychiatric: He has a normal mood and affect. His speech is normal and behavior is normal.  Nursing note and vitals reviewed.    ED  Treatments / Results  Labs (all labs ordered are listed, but only abnormal results are displayed) Labs Reviewed  CBC WITH DIFFERENTIAL/PLATELET  BASIC METABOLIC PANEL    EKG  EKG Interpretation None       Radiology No results found.  Procedures Procedures (including critical care time)  Medications Ordered in ED Medications  0.9 %  sodium chloride infusion (not administered)  morphine 4 MG/ML injection 4 mg (not administered)     Initial Impression / Assessment and Plan / ED Course  I have reviewed the triage vital signs and the nursing notes.  Pertinent labs & imaging results that were available during my care of the patient were reviewed by me and considered in my medical decision making (see chart for details).     Patient medicated for pain here feels better. Patient's MRI was reviewed with him and he will be given referral to neurosurgeon on call  Final Clinical Impressions(s) / ED Diagnoses   Final diagnoses:  None    New Prescriptions New Prescriptions   No medications on file     Lorre Nick, MD 04/20/17 2354

## 2017-12-14 HISTORY — PX: POSTERIOR FUSION CERVICAL SPINE: SUR628

## 2018-07-13 HISTORY — PX: ANTERIOR FUSION CERVICAL SPINE: SUR626

## 2018-12-04 ENCOUNTER — Ambulatory Visit: Payer: Self-pay | Admitting: Internal Medicine
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
## 2019-06-20 ENCOUNTER — Other Ambulatory Visit: Payer: Self-pay

## 2019-06-20 ENCOUNTER — Ambulatory Visit (INDEPENDENT_AMBULATORY_CARE_PROVIDER_SITE_OTHER): Payer: 59 | Admitting: Internal Medicine

## 2019-06-20 ENCOUNTER — Encounter: Payer: Self-pay | Admitting: Internal Medicine

## 2019-06-20 VITALS — BP 162/100 | HR 68 | Temp 98.5°F | Ht 66.8 in | Wt 191.4 lb

## 2019-06-20 DIAGNOSIS — I10 Essential (primary) hypertension: Secondary | ICD-10-CM

## 2019-06-20 DIAGNOSIS — F4321 Adjustment disorder with depressed mood: Secondary | ICD-10-CM

## 2019-06-20 DIAGNOSIS — Z1211 Encounter for screening for malignant neoplasm of colon: Secondary | ICD-10-CM | POA: Diagnosis not present

## 2019-06-20 DIAGNOSIS — Z0000 Encounter for general adult medical examination without abnormal findings: Secondary | ICD-10-CM

## 2019-06-20 DIAGNOSIS — M542 Cervicalgia: Secondary | ICD-10-CM

## 2019-06-20 LAB — POCT URINALYSIS DIPSTICK
Bilirubin, UA: NEGATIVE
Blood, UA: NEGATIVE
Glucose, UA: NEGATIVE
Ketones, UA: NEGATIVE
Leukocytes, UA: NEGATIVE
Nitrite, UA: NEGATIVE
Protein, UA: NEGATIVE
Spec Grav, UA: 1.01 (ref 1.010–1.025)
Urobilinogen, UA: 0.2 E.U./dL
pH, UA: 5.5 (ref 5.0–8.0)

## 2019-06-20 LAB — POC HEMOCCULT BLD/STL (OFFICE/1-CARD/DIAGNOSTIC): Fecal Occult Blood, POC: NEGATIVE

## 2019-06-20 LAB — POCT UA - MICROALBUMIN
Albumin/Creatinine Ratio, Urine, POC: <30
Creatinine, POC: 10 mg/dL
Microalbumin Ur, POC: 10 mg/L

## 2019-06-20 MED ORDER — GABAPENTIN 100 MG PO CAPS: ORAL_CAPSULE | ORAL | 1 refills | Status: DC

## 2019-06-20 MED ORDER — OLMESARTAN MEDOXOMIL 20 MG PO TABS: 20.0000 mg | ORAL_TABLET | Freq: Every day | ORAL | 1 refills | Status: DC

## 2019-06-20 NOTE — Patient Instructions (Signed)

## 2019-06-21 LAB — CMP14+EGFR
ALT: 23 IU/L (ref 0–44)
AST: 16 IU/L (ref 0–40)
Albumin/Globulin Ratio: 1.9 (ref 1.2–2.2)
Albumin: 5.1 g/dL — ABNORMAL HIGH (ref 4.0–5.0)
Alkaline Phosphatase: 93 IU/L (ref 39–117)
BUN/Creatinine Ratio: 8 — ABNORMAL LOW (ref 9–20)
BUN: 7 mg/dL (ref 6–24)
Bilirubin Total: 0.7 mg/dL (ref 0.0–1.2)
CO2: 20 mmol/L (ref 20–29)
Calcium: 10.3 mg/dL — ABNORMAL HIGH (ref 8.7–10.2)
Chloride: 98 mmol/L (ref 96–106)
Creatinine, Ser: 0.85 mg/dL (ref 0.76–1.27)
GFR calc Af Amer: 118 mL/min/{1.73_m2} (ref >59)
GFR calc non Af Amer: 102 mL/min/{1.73_m2} (ref >59)
Globulin, Total: 2.7 g/dL (ref 1.5–4.5)
Glucose: 94 mg/dL (ref 65–99)
Potassium: 4.2 mmol/L (ref 3.5–5.2)
Sodium: 136 mmol/L (ref 134–144)
Total Protein: 7.8 g/dL (ref 6.0–8.5)

## 2019-06-21 LAB — HEMOGLOBIN A1C
Est. average glucose Bld gHb Est-mCnc: 120 mg/dL
Hgb A1c MFr Bld: 5.8 % — ABNORMAL HIGH (ref 4.8–5.6)

## 2019-06-21 LAB — CBC
Hematocrit: 48.3 % (ref 37.5–51.0)
Hemoglobin: 16.3 g/dL (ref 13.0–17.7)
MCH: 29.4 pg (ref 26.6–33.0)
MCHC: 33.7 g/dL (ref 31.5–35.7)
MCV: 87 fL (ref 79–97)
Platelets: 284 10*3/uL (ref 150–450)
RBC: 5.54 x10E6/uL (ref 4.14–5.80)
RDW: 13.1 % (ref 11.6–15.4)
WBC: 6.1 10*3/uL (ref 3.4–10.8)

## 2019-06-21 LAB — PSA: Prostate Specific Ag, Serum: 0.8 ng/mL (ref 0.0–4.0)

## 2019-06-21 LAB — LIPID PANEL
Chol/HDL Ratio: 5.7 ratio — ABNORMAL HIGH (ref 0.0–5.0)
Cholesterol, Total: 274 mg/dL — ABNORMAL HIGH (ref 100–199)
HDL: 48 mg/dL (ref >39)
LDL Calculated: 202 mg/dL — ABNORMAL HIGH (ref 0–99)
Triglycerides: 120 mg/dL (ref 0–149)
VLDL Cholesterol Cal: 24 mg/dL (ref 5–40)

## 2019-07-01 NOTE — Progress Notes (Signed)
Subjective:     Patient ID: James Diaz , male    DOB: 1971/04/15 , 48 y.o.   MRN: [id number]   Chief Complaint  Patient presents with  . Annual Exam    HPI  He is here today for a full physical examination. He reports he has been having issues with chronic neck pain. Had anterior/posterior cervical spine surgery last year.  Unfortunately, neither surgery has improved his symptoms. Movement exacerbates his symptoms. Described as dull, throbbing pain. He reports that the chronic pain has affected his mood. He is discouraged by the pain.     Past Medical History:  Diagnosis Date  . Acid reflux   . Anxiety   . Depression   . Hypertension   . Multiple allergies   . Other and unspecified hyperlipidemia      Family History  Problem Relation Age of Onset  . Heart Problems Mother   . Hypertension Mother   . Other Mother        kidney problem  . Healthy Father      Current Outpatient Medications:  .  dexlansoprazole (DEXILANT) 60 MG capsule, Take 60 mg by mouth daily.  , Disp: , Rfl:  .  nortriptyline (PAMELOR) 10 MG capsule, , Disp: , Rfl:  .  gabapentin (NEURONTIN) 100 MG capsule, One capsule po qhs, Disp: 90 capsule, Rfl: 1 .  methocarbamol (ROBAXIN-750) 750 MG tablet, Take 1 tablet (750 mg total) by mouth 4 (four) times daily. (Patient not taking: Reported on 06/20/2019), Disp: 30 tablet, Rfl: 0 .  naproxen sodium (ANAPROX) 220 MG tablet, Take 440 mg by mouth 2 (two) times daily with a meal., Disp: , Rfl:  .  olmesartan (BENICAR) 20 MG tablet, Take 1 tablet (20 mg total) by mouth daily., Disp: 30 tablet, Rfl: 1 .  oxyCODONE-acetaminophen (PERCOCET/ROXICET) 5-325 MG tablet, Take 2 tablets by mouth every 4 (four) hours as needed for severe pain. (Patient not taking: Reported on 06/20/2019), Disp: 20 tablet, Rfl: 0 .  rizatriptan (MAXALT) 10 MG tablet, Take 1 tablet (10 mg total) by mouth once as needed for migraine. May repeat in 2 hours if needed. Max 8 per month. (Patient not  taking: Reported on 04/20/2017), Disp: 8 tablet, Rfl: 2   No Known Allergies   Men's preventive visit. Patient Health Questionnaire (PHQ-2) is    Office Visit from 06/20/2019 in [facility]  PHQ-2 Total Score  2    . Patient is on a somewhat healthy diet. Marital status: Single. Relevant history for alcohol use is:  Social History   Substance and Sexual Activity  Alcohol Use Yes   Comment: weekends  . Relevant history for tobacco use is:  Social History   Tobacco Use  Smoking Status Former Smoker  . Packs/day: 0.25  . Years: 2.00  . Pack years: 0.50  . Types: Cigarettes  . Quit date: 12/01/2007  . Years since quitting: 11.5  Smokeless Tobacco Never Used  .  Review of Systems  Constitutional: Negative.   HENT: Negative.   Eyes: Negative.   Respiratory: Negative.   Cardiovascular: Negative.   Endocrine: Negative.   Genitourinary: Negative.   Musculoskeletal: Positive for neck pain.  Skin: Negative.   Allergic/Immunologic: Negative.   Neurological: Negative.   Hematological: Negative.   Psychiatric/Behavioral: Positive for dysphoric mood.     Today's Vitals   06/20/19 1440  BP: (!) 162/100  Pulse: 68  Temp: 98.5 F (36.9 C)  TempSrc: Oral  Weight: 191  lb 6.4 oz (86.8 kg)  Height: 5' 6.8" (1.697 m)  PainSc: 5   PainLoc: Neck   Body mass index is 30.16 kg/m.   Objective:  Physical Exam Vitals signs and nursing note reviewed.  Constitutional:      Appearance: Normal appearance.  HENT:     Head: Normocephalic and atraumatic.     Right Ear: Tympanic membrane, ear canal and external ear normal.     Left Ear: Tympanic membrane, ear canal and external ear normal.     Nose: Nose normal.     Mouth/Throat:     Mouth: Mucous membranes are moist.     Pharynx: Oropharynx is clear.  Eyes:     Extraocular Movements: Extraocular movements intact.     Conjunctiva/sclera: Conjunctivae normal.     Pupils: Pupils are equal, round, and reactive to  light.  Neck:     Musculoskeletal: Muscular tenderness present.  Cardiovascular:     Rate and Rhythm: Normal rate and regular rhythm.     Pulses: Normal pulses.     Heart sounds: Normal heart sounds.  Pulmonary:     Effort: Pulmonary effort is normal.     Breath sounds: Normal breath sounds.  Chest:     Breasts:        Right: Normal. No swelling, bleeding, inverted nipple, mass or nipple discharge.        Left: Normal. No swelling, bleeding, inverted nipple, mass or nipple discharge.  Abdominal:     General: Abdomen is flat. Bowel sounds are normal.     Palpations: Abdomen is soft.  Genitourinary:    Prostate: Normal.     Rectum: Normal. Guaiac result negative.  Musculoskeletal: Normal range of motion.  Skin:    General: Skin is warm.  Neurological:     General: No focal deficit present.     Mental Status: He is alert.  Psychiatric:        Mood and Affect: Mood normal.        Behavior: Behavior normal.         Assessment And Plan:     1. Routine general medical examination at health care facility   A full exam was performed. DRE performed, stool heme negative. PATIENT HAS BEEN ADVISED TO GET 30-45 MINUTES REGULAR EXERCISE NO LESS THAN FOUR TO FIVE DAYS PER WEEK - BOTH WEIGHTBEARING EXERCISES AND AEROBIC ARE RECOMMENDED.  HE IS ADVISED TO FOLLOW A HEALTHY DIET WITH AT LEAST SIX FRUITS/VEGGIES PER DAY, DECREASE INTAKE OF RED MEAT, AND TO INCREASE FISH INTAKE TO TWO DAYS PER WEEK.  MEATS/FISH SHOULD NOT BE FRIED, BAKED OR BROILED IS PREFERABLE.  I SUGGEST WEARING SPF 50 SUNSCREEN ON EXPOSED PARTS AND ESPECIALLY WHEN IN THE DIRECT SUNLIGHT FOR AN EXTENDED PERIOD OF TIME.  PLEASE AVOID FAST FOOD RESTAURANTS AND INCREASE YOUR WATER INTAKE.  - CMP14+EGFR - CBC - Lipid panel - Hemoglobin A1c - POC Hemoccult Bld/Stl (1-Cd Office Dx) - PSA  2. Essential hypertension, benign  Newly diagnosed.  I will start him on Olmesartan 27m daily. He is advised to take with evening meal.  Possible side effects were discussed with the patient - including headaches, nausea and dizziness. He agrees to rto in four weeks for re-evaluation. He is encouraged to cut back on his salt intake. EKG performed, no acute changes noted.    - EKG 12-Lead - POCT UA - Microalbumin - POCT Urinalysis Dipstick (81002)  3. Cervicalgia  Chronic. I will start him on gabapentin 1026mnightly.  He will rto in four weeks. Possible side effects were discussed with the patient. If tolerated and pain persists, I will increase him to 2 capsules nightly within two weeks. All questions were answered to his satisfaction. He is encouraged to contact me in a week or two to let me know how he is doing.    4. Adjustment disorder with depressed mood  He does not wish to take any rx medications. He does agree to therapy. An appropriate referral was placed.   Maximino Greenland, MD    THE PATIENT IS ENCOURAGED TO PRACTICE SOCIAL DISTANCING DUE TO THE COVID-19 PANDEMIC.

## 2019-07-04 ENCOUNTER — Encounter: Payer: Self-pay | Admitting: Internal Medicine

## 2019-07-12 ENCOUNTER — Other Ambulatory Visit: Payer: Self-pay | Admitting: Internal Medicine

## 2019-07-18 ENCOUNTER — Ambulatory Visit (INDEPENDENT_AMBULATORY_CARE_PROVIDER_SITE_OTHER): Payer: 59 | Admitting: Internal Medicine

## 2019-07-18 ENCOUNTER — Other Ambulatory Visit: Payer: Self-pay

## 2019-07-18 ENCOUNTER — Encounter: Payer: Self-pay | Admitting: Internal Medicine

## 2019-07-18 VITALS — BP 154/90 | HR 107 | Temp 98.5°F | Ht 66.8 in | Wt 191.8 lb

## 2019-07-18 DIAGNOSIS — M4712 Other spondylosis with myelopathy, cervical region: Secondary | ICD-10-CM

## 2019-07-18 DIAGNOSIS — I10 Essential (primary) hypertension: Secondary | ICD-10-CM | POA: Diagnosis not present

## 2019-07-18 DIAGNOSIS — Z79899 Other long term (current) drug therapy: Secondary | ICD-10-CM

## 2019-07-18 MED ORDER — OLMESARTAN MEDOXOMIL 40 MG PO TABS: 40.0000 mg | ORAL_TABLET | Freq: Every day | ORAL | 1 refills | Status: DC

## 2019-07-18 NOTE — Progress Notes (Signed)
  Subjective:     Patient ID: James Diaz , male    DOB: Apr 23, 1971 , 48 y.o.   MRN: [id number]   Chief Complaint  Patient presents with  . Hypertension    HPI  He is here today for bp check. He was started on olmesartan '20mg'$  once daily at his last visit. He has not had any issues with the medication. Unfortunately, he is still having neck pain.     Past Medical History:  Diagnosis Date  . Acid reflux   . Anxiety   . Depression   . Hypertension   . Multiple allergies   . Other and unspecified hyperlipidemia      Family History  Problem Relation Age of Onset  . Heart Problems Mother   . Hypertension Mother   . Other Mother        kidney problem  . Healthy Father      Current Outpatient Medications:  .  dexlansoprazole (DEXILANT) 60 MG capsule, Take 60 mg by mouth daily.  , Disp: , Rfl:  .  gabapentin (NEURONTIN) 100 MG capsule, 4 (four) times daily. , Disp: , Rfl:  .  nortriptyline (PAMELOR) 10 MG capsule, , Disp: , Rfl:  .  olmesartan (BENICAR) 40 MG tablet, Take 1 tablet (40 mg total) by mouth daily., Disp: 90 tablet, Rfl: 1   No Known Allergies   Review of Systems  Constitutional: Negative.   Respiratory: Negative.   Cardiovascular: Negative.   Gastrointestinal: Negative.   Musculoskeletal: Positive for neck pain.  Neurological: Negative.   Psychiatric/Behavioral: Negative.      Today's Vitals   07/18/19 1545  BP: (!) 154/90  Pulse: (!) 107  Temp: 98.5 F (36.9 C)  TempSrc: Oral  Weight: 191 lb 12.8 oz (87 kg)  Height: 5' 6.8" (1.697 m)  PainSc: 5   PainLoc: Neck   Body mass index is 30.22 kg/m.   Objective:  Physical Exam Vitals signs and nursing note reviewed.  Constitutional:      Appearance: Normal appearance.  Cardiovascular:     Rate and Rhythm: Normal rate and regular rhythm.     Heart sounds: Normal heart sounds.  Pulmonary:     Effort: Pulmonary effort is normal.     Breath sounds: Normal breath sounds.  Skin:    General: Skin  is warm.  Neurological:     General: No focal deficit present.     Mental Status: He is alert.  Psychiatric:        Mood and Affect: Mood normal.         Assessment And Plan:     1. Essential hypertension, benign  Improved control, not yet at goal. I will increase his olmesartan to '40mg'$  daily. He will rto in six weeks for re-evaluation. I will check a BMP today. He is in agreement with his treatment plan. Unfortunately, this is likely exacerbated by his cervical spine disease.   2. Spondylosis, cervical, with myelopathy  Chronic. MRI cervical spine results from [facility] reviewed.  Additionally, neurology is adjusting his gabapentin dose so he will eventually dose with '300mg'$  po tid. He will continue to follow their titration protocol.   3. Drug therapy  - BMP8+EGFR   Maximino Greenland, MD    THE PATIENT IS ENCOURAGED TO PRACTICE SOCIAL DISTANCING DUE TO THE COVID-19 PANDEMIC.

## 2019-07-18 NOTE — Patient Instructions (Signed)

## 2019-07-19 LAB — BMP8+EGFR
BUN/Creatinine Ratio: 8 — ABNORMAL LOW (ref 9–20)
BUN: 7 mg/dL (ref 6–24)
CO2: 23 mmol/L (ref 20–29)
Calcium: 10.1 mg/dL (ref 8.7–10.2)
Chloride: 100 mmol/L (ref 96–106)
Creatinine, Ser: 0.9 mg/dL (ref 0.76–1.27)
GFR calc Af Amer: 116 mL/min/{1.73_m2} (ref >59)
GFR calc non Af Amer: 100 mL/min/{1.73_m2} (ref >59)
Glucose: 123 mg/dL — ABNORMAL HIGH (ref 65–99)
Potassium: 4.3 mmol/L (ref 3.5–5.2)
Sodium: 142 mmol/L (ref 134–144)

## 2019-08-06 ENCOUNTER — Other Ambulatory Visit: Payer: Self-pay | Admitting: Internal Medicine

## 2019-08-09 ENCOUNTER — Other Ambulatory Visit: Payer: Self-pay

## 2019-08-09 DIAGNOSIS — Z20822 Contact with and (suspected) exposure to covid-19: Secondary | ICD-10-CM

## 2019-08-11 LAB — NOVEL CORONAVIRUS, NAA: SARS-CoV-2, NAA: NOT DETECTED

## 2019-08-29 ENCOUNTER — Encounter: Payer: Self-pay | Admitting: Internal Medicine

## 2019-08-29 ENCOUNTER — Ambulatory Visit (INDEPENDENT_AMBULATORY_CARE_PROVIDER_SITE_OTHER): Payer: 59 | Admitting: Internal Medicine

## 2019-08-29 ENCOUNTER — Other Ambulatory Visit: Payer: Self-pay

## 2019-08-29 VITALS — BP 138/78 | HR 80 | Temp 98.2°F | Ht 66.8 in | Wt 193.8 lb

## 2019-08-29 DIAGNOSIS — Z23 Encounter for immunization: Secondary | ICD-10-CM

## 2019-08-29 DIAGNOSIS — I10 Essential (primary) hypertension: Secondary | ICD-10-CM | POA: Diagnosis not present

## 2019-08-29 DIAGNOSIS — M542 Cervicalgia: Secondary | ICD-10-CM | POA: Diagnosis not present

## 2019-08-29 DIAGNOSIS — Z6830 Body mass index (BMI) 30.0-30.9, adult: Secondary | ICD-10-CM

## 2019-08-29 DIAGNOSIS — E6609 Other obesity due to excess calories: Secondary | ICD-10-CM

## 2019-08-29 DIAGNOSIS — G44209 Tension-type headache, unspecified, not intractable: Secondary | ICD-10-CM | POA: Diagnosis not present

## 2019-08-29 MED ORDER — CYCLOBENZAPRINE HCL 10 MG PO TABS: 10.0000 mg | ORAL_TABLET | Freq: Every evening | ORAL | 0 refills | Status: AC | PRN

## 2019-08-29 NOTE — Patient Instructions (Signed)
Tension Headache, Adult A tension headache is pain, pressure, or aching in your head. Tension headaches can last from 30 minutes to several days. Follow these instructions at home: Managing pain  Take over-the-counter and prescription medicines only as told by your doctor.  When you have a headache, lie down in a dark, quiet room.  If told, put ice on your head and neck: ? Put ice in a plastic bag. ? Place a towel between your skin and the bag. ? Leave the ice on for 20 minutes, 2-3 times a day.  If told, put heat on the back of your neck. Do this as often as your doctor tells you to. Use the kind of heat that your doctor recommends, such as a moist heat pack or a heating pad. ? Place a towel between your skin and the heat. ? Leave the heat on for 20-30 minutes. ? Remove the heat if your skin turns bright red. Eating and drinking  Eat meals on a regular schedule.  Watch how much alcohol you drink: ? If you are a woman and are not pregnant, do not drink more than 1 drink a day. ? If you are a man, do not drink more than 2 drinks a day.  Drink enough fluid to keep your pee (urine) pale yellow.  Do not use a lot of caffeine, or stop using caffeine. Lifestyle  Get enough sleep. Get 7-9 hours of sleep each night. Or get the amount of sleep that your doctor tells you to.  At bedtime, remove all electronic devices from your room. Examples of electronic devices are computers, phones, and tablets.  Find ways to lessen your stress. Some things that can lessen stress are: ? Exercise. ? Deep breathing. ? Yoga. ? Music. ? Positive thoughts.  Sit up straight. Do not tighten (tense) your muscles.  Do not use any products that have nicotine or tobacco in them, such as cigarettes and e-cigarettes. If you need help quitting, ask your doctor. General instructions   Keep all follow-up visits as told by your doctor. This is important.  Avoid things that can bring on headaches. Keep a  journal to find out if certain things bring on headaches. For example, write down: ? What you eat and drink. ? How much sleep you get. ? Any change to your diet or medicines. Contact a doctor if:  Your headache does not get better.  Your headache comes back.  You have a headache and sounds, light, or smells bother you.  You feel sick to your stomach (nauseous) or you throw up (vomit).  Your stomach hurts. Get help right away if:  You suddenly get a very bad headache along with any of these: ? A stiff neck. ? Feeling sick to your stomach. ? Throwing up. ? Feeling weak. ? Trouble seeing. ? Feeling short of breath. ? A rash. ? Feeling unusually sleepy. ? Trouble speaking. ? Pain in your eye or ear. ? Trouble walking or balancing. ? Feeling like you will pass out (faint). ? Passing out. Summary  A tension headache is pain, pressure, or aching in your head.  Tension headaches can last from 30 minutes to several days.  Lifestyle changes and medicines may help relieve pain. This information is not intended to replace advice given to you by your health care provider. Make sure you discuss any questions you have with your health care provider. Document Released: 02/23/2010 Document Revised: 11/11/2017 Document Reviewed: 03/11/2017 Elsevier Patient Education  2020 Elsevier   Inc.  

## 2019-08-30 LAB — BMP8+EGFR
BUN/Creatinine Ratio: 16 (ref 9–20)
BUN: 12 mg/dL (ref 6–24)
CO2: 23 mmol/L (ref 20–29)
Calcium: 9.7 mg/dL (ref 8.7–10.2)
Chloride: 105 mmol/L (ref 96–106)
Creatinine, Ser: 0.75 mg/dL — ABNORMAL LOW (ref 0.76–1.27)
GFR calc Af Amer: 125 mL/min/{1.73_m2} (ref >59)
GFR calc non Af Amer: 108 mL/min/{1.73_m2} (ref >59)
Glucose: 80 mg/dL (ref 65–99)
Potassium: 4.4 mmol/L (ref 3.5–5.2)
Sodium: 144 mmol/L (ref 134–144)

## 2019-09-03 NOTE — Progress Notes (Signed)
Subjective:     Patient ID: James Diaz , male    DOB: November 20, 1971 , 48 y.o.   MRN: [id number]   Chief Complaint  Patient presents with  . Hypertension    HPI  He is here today for a bp check. The dose of olmesartan was increased to 22m at his last visit. He has tolerated this dose without any issues. He reports compliance with meds.     Past Medical History:  Diagnosis Date  . Acid reflux   . Anxiety   . Depression   . Hypertension   . Multiple allergies   . Other and unspecified hyperlipidemia      Family History  Problem Relation Age of Onset  . Heart Problems Mother   . Hypertension Mother   . Other Mother        kidney problem  . Healthy Father      Current Outpatient Medications:  .  cyclobenzaprine (FLEXERIL) 10 MG tablet, Take 1 tablet (10 mg total) by mouth at bedtime as needed for muscle spasms., Disp: 30 tablet, Rfl: 0 .  dexlansoprazole (DEXILANT) 60 MG capsule, Take 60 mg by mouth daily.  , Disp: , Rfl:  .  gabapentin (NEURONTIN) 100 MG capsule, 4 (four) times daily. , Disp: , Rfl:  .  nortriptyline (PAMELOR) 10 MG capsule, , Disp: , Rfl:  .  olmesartan (BENICAR) 40 MG tablet, Take 1 tablet (40 mg total) by mouth daily., Disp: 90 tablet, Rfl: 1   No Known Allergies   Review of Systems  Constitutional: Negative.   Respiratory: Negative.   Cardiovascular: Negative.   Gastrointestinal: Negative.   Musculoskeletal: Positive for neck pain.  Neurological: Positive for headaches.  Psychiatric/Behavioral: Negative.      Today's Vitals   08/29/19 1019  BP: 138/78  Pulse: 80  Temp: 98.2 F (36.8 C)  TempSrc: Oral  SpO2: 97%  Weight: 193 lb 12.8 oz (87.9 kg)  Height: 5' 6.8" (1.697 m)   Body mass index is 30.54 kg/m.   Objective:  Physical Exam Vitals signs and nursing note reviewed.  Constitutional:      Appearance: Normal appearance.  Neck:     Musculoskeletal: Muscular tenderness present.  Cardiovascular:     Rate and Rhythm: Normal  rate and regular rhythm.     Heart sounds: Normal heart sounds.  Pulmonary:     Effort: Pulmonary effort is normal.     Breath sounds: Normal breath sounds.  Skin:    General: Skin is warm.  Neurological:     General: No focal deficit present.     Mental Status: He is alert.  Psychiatric:        Mood and Affect: Mood normal.         Assessment And Plan:     1. Essential hypertension, benign  His bp has improved, not yet optimal control. He will continue with current meds. I will check bmp today. He is encouraged to take magnesium nightly.   - BMP8+EGFR  2. Cervicalgia  Chronic.  He was given rx cyclobenzaprine to take nightly as needed.   3. Tension headache  He is advised that magnesium nightly should help to decrease severity and frequency of his symptoms. He is also encouraged to stay well hydrated.   4. Need for influenza vaccination  - Flu Vaccine QUAD 6+ mos PF IM (Fluarix Quad PF)  5. Class 1 obesity due to excess calories with serious comorbidity and body mass index (BMI) of 30.0  to 30.9 in adult  He is encouraged to strive for BMI less than 27 to decrease cardiac risk.   Maximino Greenland, MD    THE PATIENT IS ENCOURAGED TO PRACTICE SOCIAL DISTANCING DUE TO THE COVID-19 PANDEMIC.

## 2019-12-26 ENCOUNTER — Encounter: Payer: Self-pay | Admitting: Internal Medicine

## 2019-12-26 ENCOUNTER — Other Ambulatory Visit: Payer: Self-pay

## 2019-12-26 ENCOUNTER — Ambulatory Visit (INDEPENDENT_AMBULATORY_CARE_PROVIDER_SITE_OTHER): Payer: BC Managed Care – PPO | Admitting: Internal Medicine

## 2019-12-26 VITALS — BP 136/98 | HR 66 | Temp 98.4°F | Ht 66.8 in | Wt 189.9 lb

## 2019-12-26 DIAGNOSIS — Z6829 Body mass index (BMI) 29.0-29.9, adult: Secondary | ICD-10-CM

## 2019-12-26 DIAGNOSIS — E663 Overweight: Secondary | ICD-10-CM | POA: Diagnosis not present

## 2019-12-26 DIAGNOSIS — G894 Chronic pain syndrome: Secondary | ICD-10-CM

## 2019-12-26 DIAGNOSIS — I10 Essential (primary) hypertension: Secondary | ICD-10-CM | POA: Diagnosis not present

## 2019-12-26 DIAGNOSIS — M5412 Radiculopathy, cervical region: Secondary | ICD-10-CM | POA: Diagnosis not present

## 2019-12-26 NOTE — Patient Instructions (Addendum)
COVID-19 Vaccine Information can be found at: [email or web address] For questions related to vaccine distribution or appointments, please email [email or web address] or call [phone].   Start Calm, magnesium powder - one tsp at night x 1 week, then increase to 2 tsp  DASH Eating Plan DASH stands for "Dietary Approaches to Stop Hypertension." The DASH eating plan is a healthy eating plan that has been shown to reduce high blood pressure (hypertension). It may also reduce your risk for type 2 diabetes, heart disease, and stroke. The DASH eating plan may also help with weight loss. What are tips for following this plan?  General guidelines  Avoid eating more than 2,300 mg (milligrams) of salt (sodium) a day. If you have hypertension, you may need to reduce your sodium intake to 1,500 mg a day.  Limit alcohol intake to no more than 1 drink a day for nonpregnant women and 2 drinks a day for men. One drink equals 12 oz of beer, 5 oz of wine, or 1 oz of hard liquor.  Work with your health care provider to maintain a healthy body weight or to lose weight. Ask what an ideal weight is for you.  Get at least 30 minutes of exercise that causes your heart to beat faster (aerobic exercise) most days of the week. Activities may include walking, swimming, or biking.  Work with your health care provider or diet and nutrition specialist (dietitian) to adjust your eating plan to your individual calorie needs. Reading food labels   Check food labels for the amount of sodium per serving. Choose foods with less than 5 percent of the Daily Value of sodium. Generally, foods with less than 300 mg of sodium per serving fit into this eating plan.  To find whole grains, look for the word "whole" as the first word in the ingredient list. Shopping  Buy products labeled as "low-sodium" or "no salt added."  Buy fresh foods. Avoid canned foods and premade or  frozen meals. Cooking  Avoid adding salt when cooking. Use salt-free seasonings or herbs instead of table salt or sea salt. Check with your health care provider or pharmacist before using salt substitutes.  Do not fry foods. Cook foods using healthy methods such as baking, boiling, grilling, and broiling instead.  Cook with heart-healthy oils, such as olive, canola, soybean, or sunflower oil. Meal planning  Eat a balanced diet that includes: ? 5 or more servings of fruits and vegetables each day. At each meal, try to fill half of your plate with fruits and vegetables. ? Up to 6-8 servings of whole grains each day. ? Less than 6 oz of lean meat, poultry, or fish each day. A 3-oz serving of meat is about the same size as a deck of cards. One egg equals 1 oz. ? 2 servings of low-fat dairy each day. ? A serving of nuts, seeds, or beans 5 times each week. ? Heart-healthy fats. Healthy fats called Omega-3 fatty acids are found in foods such as flaxseeds and coldwater fish, like sardines, salmon, and mackerel.  Limit how much you eat of the following: ? Canned or prepackaged foods. ? Food that is high in trans fat, such as fried foods. ? Food that is high in saturated fat, such as fatty meat. ? Sweets, desserts, sugary drinks, and other foods with added sugar. ? Full-fat dairy products.  Do not salt foods before eating.  Try to eat at least 2 vegetarian meals each week.  Eat more home-cooked food and less restaurant,  buffet, and fast food.  When eating at a restaurant, ask that your food be prepared with less salt or no salt, if possible. What foods are recommended? The items listed may not be a complete list. Talk with your dietitian about what dietary choices are best for you. Grains Whole-grain or whole-wheat bread. Whole-grain or whole-wheat pasta. Brown rice. [place]. Bulgur. Whole-grain and low-sodium cereals. Pita bread. Low-fat, low-sodium crackers. Whole-wheat flour  tortillas. Vegetables Fresh or frozen vegetables (raw, steamed, roasted, or grilled). Low-sodium or reduced-sodium tomato and vegetable juice. Low-sodium or reduced-sodium tomato sauce and tomato paste. Low-sodium or reduced-sodium canned vegetables. Fruits All fresh, dried, or frozen fruit. Canned fruit in natural juice (without added sugar). Meat and other protein foods Skinless chicken or [place]. Ground chicken or [place]. Pork with fat trimmed off. Fish and seafood. Egg whites. Dried beans, peas, or lentils. Unsalted nuts, nut butters, and seeds. Unsalted canned beans. Lean cuts of beef with fat trimmed off. Low-sodium, lean deli meat. Dairy Low-fat (1%) or fat-free (skim) milk. Fat-free, low-fat, or reduced-fat cheeses. Nonfat, low-sodium ricotta or cottage cheese. Low-fat or nonfat yogurt. Low-fat, low-sodium cheese. Fats and oils Soft margarine without trans fats. Vegetable oil. Low-fat, reduced-fat, or light mayonnaise and salad dressings (reduced-sodium). Canola, safflower, olive, soybean, and sunflower oils. Avocado. Seasoning and other foods Herbs. Spices. Seasoning mixes without salt. Unsalted popcorn and pretzels. Fat-free sweets. What foods are not recommended? The items listed may not be a complete list. Talk with your dietitian about what dietary choices are best for you. Grains Baked goods made with fat, such as croissants, muffins, or some breads. Dry pasta or rice meal packs. Vegetables Creamed or fried vegetables. Vegetables in a cheese sauce. Regular canned vegetables (not low-sodium or reduced-sodium). Regular canned tomato sauce and paste (not low-sodium or reduced-sodium). Regular tomato and vegetable juice (not low-sodium or reduced-sodium). Angie Fava. Olives. Fruits Canned fruit in a light or heavy syrup. Fried fruit. Fruit in cream or butter sauce. Meat and other protein foods Fatty cuts of meat. Ribs. Fried meat. Berniece Salines. Sausage. Bologna and other processed lunch meats.  Salami. Fatback. Hotdogs. Bratwurst. Salted nuts and seeds. Canned beans with added salt. Canned or smoked fish. Whole eggs or egg yolks. Chicken or [place] with skin. Dairy Whole or 2% milk, cream, and half-and-half. Whole or full-fat cream cheese. Whole-fat or sweetened yogurt. Full-fat cheese. Nondairy creamers. Whipped toppings. Processed cheese and cheese spreads. Fats and oils Butter. Stick margarine. Lard. Shortening. Ghee. Bacon fat. Tropical oils, such as coconut, palm kernel, or palm oil. Seasoning and other foods Salted popcorn and pretzels. Onion salt, garlic salt, seasoned salt, table salt, and sea salt. Worcestershire sauce. Tartar sauce. Barbecue sauce. Teriyaki sauce. Soy sauce, including reduced-sodium. Steak sauce. Canned and packaged gravies. Fish sauce. Oyster sauce. Cocktail sauce. Horseradish that you find on the shelf. Ketchup. Mustard. Meat flavorings and tenderizers. Bouillon cubes. Hot sauce and Tabasco sauce. Premade or packaged marinades. Premade or packaged taco seasonings. Relishes. Regular salad dressings. Where to find more information:  National Heart, Lung, and [place]: https://wilson-eaton.com/  American Heart Association: www.heart.org Summary  The DASH eating plan is a healthy eating plan that has been shown to reduce high blood pressure (hypertension). It may also reduce your risk for type 2 diabetes, heart disease, and stroke.  With the DASH eating plan, you should limit salt (sodium) intake to 2,300 mg a day. If you have hypertension, you may need to reduce your sodium intake to 1,500 mg a day.  When  on the DASH eating plan, aim to eat more fresh fruits and vegetables, whole grains, lean proteins, low-fat dairy, and heart-healthy fats.  Work with your health care provider or diet and nutrition specialist (dietitian) to adjust your eating plan to your individual calorie needs. This information is not intended to replace advice given to you by your health  care provider. Make sure you discuss any questions you have with your health care provider. Document Revised: 11/11/2017 Document Reviewed: 11/22/2016 Elsevier Patient Education  2020 [employer].

## 2019-12-29 NOTE — Progress Notes (Signed)
This visit occurred during the SARS-CoV-2 public health emergency.  Safety protocols were in place, including screening questions prior to the visit, additional usage of staff PPE, and extensive cleaning of exam room while observing appropriate contact time as indicated for disinfecting solutions.  Subjective:     Patient ID: James Diaz , male    DOB: 1971-04-07 , 49 y.o.   MRN: [id number]   Chief Complaint  Patient presents with  . Hypertension    HPI  Hypertension This is a chronic problem. The current episode started more than 1 year ago. The problem has been gradually improving since onset. The problem is uncontrolled. Associated symptoms include neck pain. Pertinent negatives include no blurred vision, chest pain, palpitations or shortness of breath. Risk factors for coronary artery disease include male gender and sedentary lifestyle. Past treatments include angiotensin blockers.     Past Medical History:  Diagnosis Date  . Acid reflux   . Anxiety   . Depression   . Hypertension   . Multiple allergies   . Other and unspecified hyperlipidemia      Family History  Problem Relation Age of Onset  . Heart Problems Mother   . Hypertension Mother   . Other Mother        kidney problem  . Healthy Father      Current Outpatient Medications:  .  cyclobenzaprine (FLEXERIL) 10 MG tablet, Take 1 tablet (10 mg total) by mouth at bedtime as needed for muscle spasms., Disp: 30 tablet, Rfl: 0 .  dexlansoprazole (DEXILANT) 60 MG capsule, Take 60 mg by mouth daily.  , Disp: , Rfl:  .  diclofenac Sodium (VOLTAREN) 1 % GEL, Apply topically., Disp: , Rfl:  .  DULoxetine (CYMBALTA) 30 MG capsule, Take by mouth., Disp: , Rfl:  .  gabapentin (NEURONTIN) 300 MG capsule, TAKE 2 CAPSULES BY MOUTH EVERY MORNING AND AFTERNOON AND 3 CAPSULES EACH EVENING, Disp: , Rfl:  .  nortriptyline (PAMELOR) 10 MG capsule, , Disp: , Rfl:  .  olmesartan (BENICAR) 40 MG tablet, Take 1 tablet (40 mg total) by  mouth daily., Disp: 90 tablet, Rfl: 1 .  propranolol (INNOPRAN XL) 120 MG 24 hr capsule, Take by mouth., Disp: , Rfl:    No Known Allergies   Review of Systems  Constitutional: Negative.   Eyes: Negative for blurred vision.  Respiratory: Negative.  Negative for shortness of breath.   Cardiovascular: Negative.  Negative for chest pain and palpitations.  Gastrointestinal: Negative.   Musculoskeletal: Positive for neck pain.       He still has chronic neck pain. He is followed by [facility] and is planning to start acupuncture in the near future.   Neurological: Negative.   Psychiatric/Behavioral: Negative.      Today's Vitals   12/26/19 1445  BP: (!) 136/98  Pulse: 66  Temp: 98.4 F (36.9 C)  TempSrc: Oral  Weight: 189 lb 14.4 oz (86.1 kg)  Height: 5' 6.8" (1.697 m)  PainSc: 6   PainLoc: Shoulder   Body mass index is 29.92 kg/m.   Objective:  Physical Exam Vitals and nursing note reviewed.  Constitutional:      Appearance: Normal appearance.     Comments: He appears uncomfortable.   Cardiovascular:     Rate and Rhythm: Normal rate and regular rhythm.     Heart sounds: Normal heart sounds.  Pulmonary:     Effort: Pulmonary effort is normal.     Breath sounds: Normal breath sounds.  Musculoskeletal:  Cervical back: Tenderness present.  Skin:    General: Skin is warm.  Neurological:     General: No focal deficit present.     Mental Status: He is alert.  Psychiatric:        Mood and Affect: Mood normal.         Assessment And Plan:     1. Essential hypertension, benign  Chronic, fair control.  He will continue with current meds for now. BP elevation likely related to his chronic pain. He is encouraged to avoid adding salt to his foods.   2. Cervical radiculopathy  Chronic. He is encouraged to start magnesium supplementation nightly.   3. Chronic pain syndrome  Chronic. He may benefit from an increase his dose of duloxetine. Will discuss at his next visit if  acupuncture does not prove to be helpful.   4. Overweight with body mass index (BMI) of 29 to 29.9 in adult  He is encouraged to strive for BMI less than 26 to decrease cardiac risk. He is encouraged to avoid processed foods and sugary drinks since he is unable to exercise a this time due to chronic pain.   Maximino Greenland, MD    THE PATIENT IS ENCOURAGED TO PRACTICE SOCIAL DISTANCING DUE TO THE COVID-19 PANDEMIC.

## 2020-01-15 ENCOUNTER — Other Ambulatory Visit: Payer: Self-pay | Admitting: Internal Medicine

## 2020-06-26 ENCOUNTER — Encounter: Payer: 59 | Admitting: Internal Medicine

## 2020-07-12 ENCOUNTER — Other Ambulatory Visit: Payer: Self-pay | Admitting: Internal Medicine

## 2020-08-06 ENCOUNTER — Other Ambulatory Visit: Payer: Self-pay | Admitting: Internal Medicine

## 2023-04-14 DIAGNOSIS — M62838 Other muscle spasm: Secondary | ICD-10-CM | POA: Diagnosis not present

## 2023-04-14 DIAGNOSIS — G5691 Unspecified mononeuropathy of right upper limb: Secondary | ICD-10-CM | POA: Diagnosis not present

## 2023-04-14 DIAGNOSIS — R030 Elevated blood-pressure reading, without diagnosis of hypertension: Secondary | ICD-10-CM | POA: Diagnosis not present

## 2023-04-14 DIAGNOSIS — K219 Gastro-esophageal reflux disease without esophagitis: Secondary | ICD-10-CM | POA: Diagnosis not present

## 2023-04-14 DIAGNOSIS — M4322 Fusion of spine, cervical region: Secondary | ICD-10-CM | POA: Diagnosis not present

## 2023-06-23 DIAGNOSIS — R030 Elevated blood-pressure reading, without diagnosis of hypertension: Secondary | ICD-10-CM | POA: Diagnosis not present

## 2023-06-23 DIAGNOSIS — G5691 Unspecified mononeuropathy of right upper limb: Secondary | ICD-10-CM | POA: Diagnosis not present

## 2023-06-23 DIAGNOSIS — Z13220 Encounter for screening for lipoid disorders: Secondary | ICD-10-CM | POA: Diagnosis not present

## 2023-06-23 DIAGNOSIS — K219 Gastro-esophageal reflux disease without esophagitis: Secondary | ICD-10-CM | POA: Diagnosis not present

## 2023-09-23 DIAGNOSIS — E782 Mixed hyperlipidemia: Secondary | ICD-10-CM | POA: Diagnosis not present

## 2023-09-23 DIAGNOSIS — I10 Essential (primary) hypertension: Secondary | ICD-10-CM | POA: Diagnosis not present

## 2023-10-21 DIAGNOSIS — I10 Essential (primary) hypertension: Secondary | ICD-10-CM | POA: Diagnosis not present

## 2023-10-21 DIAGNOSIS — J019 Acute sinusitis, unspecified: Secondary | ICD-10-CM | POA: Diagnosis not present

## 2023-12-19 DIAGNOSIS — I10 Essential (primary) hypertension: Secondary | ICD-10-CM | POA: Diagnosis not present

## 2023-12-19 DIAGNOSIS — E782 Mixed hyperlipidemia: Secondary | ICD-10-CM | POA: Diagnosis not present

## 2023-12-19 DIAGNOSIS — M542 Cervicalgia: Secondary | ICD-10-CM | POA: Diagnosis not present

## 2024-01-07 DIAGNOSIS — I7772 Dissection of iliac artery: Secondary | ICD-10-CM | POA: Diagnosis not present

## 2024-01-07 DIAGNOSIS — I255 Ischemic cardiomyopathy: Secondary | ICD-10-CM | POA: Diagnosis not present

## 2024-01-07 DIAGNOSIS — I2109 ST elevation (STEMI) myocardial infarction involving other coronary artery of anterior wall: Secondary | ICD-10-CM | POA: Diagnosis not present

## 2024-01-07 DIAGNOSIS — R404 Transient alteration of awareness: Secondary | ICD-10-CM | POA: Diagnosis not present

## 2024-01-07 DIAGNOSIS — J9601 Acute respiratory failure with hypoxia: Secondary | ICD-10-CM | POA: Diagnosis not present

## 2024-01-07 DIAGNOSIS — I468 Cardiac arrest due to other underlying condition: Secondary | ICD-10-CM | POA: Diagnosis not present

## 2024-01-07 DIAGNOSIS — I771 Stricture of artery: Secondary | ICD-10-CM | POA: Diagnosis not present

## 2024-01-07 DIAGNOSIS — I2510 Atherosclerotic heart disease of native coronary artery without angina pectoris: Secondary | ICD-10-CM | POA: Diagnosis not present

## 2024-01-07 DIAGNOSIS — E785 Hyperlipidemia, unspecified: Secondary | ICD-10-CM | POA: Diagnosis not present

## 2024-01-07 DIAGNOSIS — I469 Cardiac arrest, cause unspecified: Secondary | ICD-10-CM | POA: Diagnosis not present

## 2024-01-07 DIAGNOSIS — I7102 Dissection of abdominal aorta: Secondary | ICD-10-CM | POA: Diagnosis not present

## 2024-01-07 DIAGNOSIS — I5020 Unspecified systolic (congestive) heart failure: Secondary | ICD-10-CM | POA: Diagnosis not present

## 2024-01-07 DIAGNOSIS — R0989 Other specified symptoms and signs involving the circulatory and respiratory systems: Secondary | ICD-10-CM | POA: Diagnosis not present

## 2024-01-07 DIAGNOSIS — N170 Acute kidney failure with tubular necrosis: Secondary | ICD-10-CM | POA: Diagnosis not present

## 2024-01-07 DIAGNOSIS — R570 Cardiogenic shock: Secondary | ICD-10-CM | POA: Diagnosis not present

## 2024-01-07 DIAGNOSIS — D696 Thrombocytopenia, unspecified: Secondary | ICD-10-CM | POA: Diagnosis not present

## 2024-01-07 DIAGNOSIS — G2581 Restless legs syndrome: Secondary | ICD-10-CM | POA: Diagnosis not present

## 2024-01-07 DIAGNOSIS — I4729 Other ventricular tachycardia: Secondary | ICD-10-CM | POA: Diagnosis not present

## 2024-01-07 DIAGNOSIS — I96 Gangrene, not elsewhere classified: Secondary | ICD-10-CM | POA: Diagnosis not present

## 2024-01-07 DIAGNOSIS — I499 Cardiac arrhythmia, unspecified: Secondary | ICD-10-CM | POA: Diagnosis not present

## 2024-01-07 DIAGNOSIS — I745 Embolism and thrombosis of iliac artery: Secondary | ICD-10-CM | POA: Diagnosis not present

## 2024-01-07 DIAGNOSIS — R9431 Abnormal electrocardiogram [ECG] [EKG]: Secondary | ICD-10-CM | POA: Diagnosis not present

## 2024-01-07 DIAGNOSIS — S0990XA Unspecified injury of head, initial encounter: Secondary | ICD-10-CM | POA: Diagnosis not present

## 2024-01-07 DIAGNOSIS — E872 Acidosis, unspecified: Secondary | ICD-10-CM | POA: Diagnosis not present

## 2024-01-07 DIAGNOSIS — E8729 Other acidosis: Secondary | ICD-10-CM | POA: Diagnosis not present

## 2024-01-07 DIAGNOSIS — I472 Ventricular tachycardia, unspecified: Secondary | ICD-10-CM | POA: Diagnosis not present

## 2024-01-07 DIAGNOSIS — N179 Acute kidney failure, unspecified: Secondary | ICD-10-CM | POA: Diagnosis not present

## 2024-01-07 DIAGNOSIS — I4901 Ventricular fibrillation: Secondary | ICD-10-CM | POA: Diagnosis not present

## 2024-01-07 DIAGNOSIS — I2102 ST elevation (STEMI) myocardial infarction involving left anterior descending coronary artery: Secondary | ICD-10-CM | POA: Diagnosis not present

## 2024-01-07 DIAGNOSIS — K7200 Acute and subacute hepatic failure without coma: Secondary | ICD-10-CM | POA: Diagnosis not present

## 2024-01-07 DIAGNOSIS — I5189 Other ill-defined heart diseases: Secondary | ICD-10-CM | POA: Diagnosis not present

## 2024-01-07 DIAGNOSIS — R55 Syncope and collapse: Secondary | ICD-10-CM | POA: Diagnosis not present

## 2024-01-07 DIAGNOSIS — I2722 Pulmonary hypertension due to left heart disease: Secondary | ICD-10-CM | POA: Diagnosis not present

## 2024-01-07 DIAGNOSIS — I340 Nonrheumatic mitral (valve) insufficiency: Secondary | ICD-10-CM | POA: Diagnosis not present

## 2024-01-07 DIAGNOSIS — I462 Cardiac arrest due to underlying cardiac condition: Secondary | ICD-10-CM | POA: Diagnosis not present

## 2024-01-07 DIAGNOSIS — R4020 Unspecified coma: Secondary | ICD-10-CM | POA: Diagnosis not present

## 2024-01-07 DIAGNOSIS — I10 Essential (primary) hypertension: Secondary | ICD-10-CM | POA: Diagnosis not present

## 2024-01-07 DIAGNOSIS — I110 Hypertensive heart disease with heart failure: Secondary | ICD-10-CM | POA: Diagnosis not present

## 2024-01-07 DIAGNOSIS — E8721 Acute metabolic acidosis: Secondary | ICD-10-CM | POA: Diagnosis not present

## 2024-01-07 DIAGNOSIS — M79A12 Nontraumatic compartment syndrome of left upper extremity: Secondary | ICD-10-CM | POA: Diagnosis not present

## 2024-01-07 DIAGNOSIS — I5021 Acute systolic (congestive) heart failure: Secondary | ICD-10-CM | POA: Diagnosis not present

## 2024-01-07 DIAGNOSIS — R935 Abnormal findings on diagnostic imaging of other abdominal regions, including retroperitoneum: Secondary | ICD-10-CM | POA: Diagnosis not present

## 2024-01-07 DIAGNOSIS — I132 Hypertensive heart and chronic kidney disease with heart failure and with stage 5 chronic kidney disease, or end stage renal disease: Secondary | ICD-10-CM | POA: Diagnosis not present

## 2024-01-07 DIAGNOSIS — R140 Abdominal distension (gaseous): Secondary | ICD-10-CM | POA: Diagnosis not present

## 2024-01-07 DIAGNOSIS — T79A11A Traumatic compartment syndrome of right upper extremity, initial encounter: Secondary | ICD-10-CM | POA: Diagnosis not present

## 2024-01-07 DIAGNOSIS — G928 Other toxic encephalopathy: Secondary | ICD-10-CM | POA: Diagnosis not present

## 2024-01-07 DIAGNOSIS — Z4682 Encounter for fitting and adjustment of non-vascular catheter: Secondary | ICD-10-CM | POA: Diagnosis not present

## 2024-01-07 DIAGNOSIS — I519 Heart disease, unspecified: Secondary | ICD-10-CM | POA: Diagnosis not present

## 2024-01-07 DIAGNOSIS — R2232 Localized swelling, mass and lump, left upper limb: Secondary | ICD-10-CM | POA: Diagnosis not present

## 2024-01-07 DIAGNOSIS — R0603 Acute respiratory distress: Secondary | ICD-10-CM | POA: Diagnosis not present

## 2024-01-07 DIAGNOSIS — G9340 Encephalopathy, unspecified: Secondary | ICD-10-CM | POA: Diagnosis not present

## 2024-01-07 DIAGNOSIS — G931 Anoxic brain damage, not elsewhere classified: Secondary | ICD-10-CM | POA: Diagnosis not present

## 2024-01-07 DIAGNOSIS — R579 Shock, unspecified: Secondary | ICD-10-CM | POA: Diagnosis not present

## 2024-01-07 DIAGNOSIS — M6282 Rhabdomyolysis: Secondary | ICD-10-CM | POA: Diagnosis not present

## 2024-01-07 DIAGNOSIS — I213 ST elevation (STEMI) myocardial infarction of unspecified site: Secondary | ICD-10-CM | POA: Diagnosis not present

## 2024-01-07 DIAGNOSIS — I119 Hypertensive heart disease without heart failure: Secondary | ICD-10-CM | POA: Diagnosis not present

## 2024-01-07 DIAGNOSIS — N186 End stage renal disease: Secondary | ICD-10-CM | POA: Diagnosis not present

## 2024-01-07 DIAGNOSIS — Z452 Encounter for adjustment and management of vascular access device: Secondary | ICD-10-CM | POA: Diagnosis not present

## 2024-01-08 DIAGNOSIS — M6282 Rhabdomyolysis: Secondary | ICD-10-CM | POA: Diagnosis not present

## 2024-01-08 DIAGNOSIS — R9431 Abnormal electrocardiogram [ECG] [EKG]: Secondary | ICD-10-CM | POA: Diagnosis not present

## 2024-01-08 DIAGNOSIS — J9601 Acute respiratory failure with hypoxia: Secondary | ICD-10-CM | POA: Diagnosis not present

## 2024-01-08 DIAGNOSIS — I472 Ventricular tachycardia, unspecified: Secondary | ICD-10-CM | POA: Diagnosis not present

## 2024-01-08 DIAGNOSIS — I119 Hypertensive heart disease without heart failure: Secondary | ICD-10-CM | POA: Diagnosis not present

## 2024-01-08 DIAGNOSIS — I2102 ST elevation (STEMI) myocardial infarction involving left anterior descending coronary artery: Secondary | ICD-10-CM | POA: Diagnosis not present

## 2024-01-08 DIAGNOSIS — R570 Cardiogenic shock: Secondary | ICD-10-CM | POA: Diagnosis not present

## 2024-01-08 DIAGNOSIS — E8721 Acute metabolic acidosis: Secondary | ICD-10-CM | POA: Diagnosis not present

## 2024-01-08 DIAGNOSIS — I462 Cardiac arrest due to underlying cardiac condition: Secondary | ICD-10-CM | POA: Diagnosis not present

## 2024-01-08 DIAGNOSIS — N170 Acute kidney failure with tubular necrosis: Secondary | ICD-10-CM | POA: Diagnosis not present

## 2024-01-08 DIAGNOSIS — I4901 Ventricular fibrillation: Secondary | ICD-10-CM | POA: Diagnosis not present

## 2024-01-08 DIAGNOSIS — I469 Cardiac arrest, cause unspecified: Secondary | ICD-10-CM | POA: Diagnosis not present

## 2024-01-08 DIAGNOSIS — I110 Hypertensive heart disease with heart failure: Secondary | ICD-10-CM | POA: Diagnosis not present

## 2024-01-08 DIAGNOSIS — Z4682 Encounter for fitting and adjustment of non-vascular catheter: Secondary | ICD-10-CM | POA: Diagnosis not present

## 2024-01-08 DIAGNOSIS — I255 Ischemic cardiomyopathy: Secondary | ICD-10-CM | POA: Diagnosis not present

## 2024-01-08 DIAGNOSIS — I5020 Unspecified systolic (congestive) heart failure: Secondary | ICD-10-CM | POA: Diagnosis not present

## 2024-01-08 DIAGNOSIS — I2510 Atherosclerotic heart disease of native coronary artery without angina pectoris: Secondary | ICD-10-CM | POA: Diagnosis not present

## 2024-01-08 DIAGNOSIS — I213 ST elevation (STEMI) myocardial infarction of unspecified site: Secondary | ICD-10-CM | POA: Diagnosis not present

## 2024-01-08 DIAGNOSIS — R579 Shock, unspecified: Secondary | ICD-10-CM | POA: Diagnosis not present

## 2024-01-08 DIAGNOSIS — I519 Heart disease, unspecified: Secondary | ICD-10-CM | POA: Diagnosis not present

## 2024-01-09 DIAGNOSIS — J9601 Acute respiratory failure with hypoxia: Secondary | ICD-10-CM | POA: Diagnosis not present

## 2024-01-09 DIAGNOSIS — Z452 Encounter for adjustment and management of vascular access device: Secondary | ICD-10-CM | POA: Diagnosis not present

## 2024-01-09 DIAGNOSIS — R579 Shock, unspecified: Secondary | ICD-10-CM | POA: Diagnosis not present

## 2024-01-09 DIAGNOSIS — M6282 Rhabdomyolysis: Secondary | ICD-10-CM | POA: Diagnosis not present

## 2024-01-09 DIAGNOSIS — E8729 Other acidosis: Secondary | ICD-10-CM | POA: Diagnosis not present

## 2024-01-09 DIAGNOSIS — I519 Heart disease, unspecified: Secondary | ICD-10-CM | POA: Diagnosis not present

## 2024-01-09 DIAGNOSIS — N170 Acute kidney failure with tubular necrosis: Secondary | ICD-10-CM | POA: Diagnosis not present

## 2024-01-09 DIAGNOSIS — I213 ST elevation (STEMI) myocardial infarction of unspecified site: Secondary | ICD-10-CM | POA: Diagnosis not present

## 2024-01-09 DIAGNOSIS — I469 Cardiac arrest, cause unspecified: Secondary | ICD-10-CM | POA: Diagnosis not present

## 2024-01-09 DIAGNOSIS — G9340 Encephalopathy, unspecified: Secondary | ICD-10-CM | POA: Diagnosis not present

## 2024-01-09 DIAGNOSIS — R570 Cardiogenic shock: Secondary | ICD-10-CM | POA: Diagnosis not present

## 2024-01-09 DIAGNOSIS — E8721 Acute metabolic acidosis: Secondary | ICD-10-CM | POA: Diagnosis not present

## 2024-01-09 DIAGNOSIS — I4901 Ventricular fibrillation: Secondary | ICD-10-CM | POA: Diagnosis not present

## 2024-01-09 DIAGNOSIS — R935 Abnormal findings on diagnostic imaging of other abdominal regions, including retroperitoneum: Secondary | ICD-10-CM | POA: Diagnosis not present

## 2024-01-09 DIAGNOSIS — N179 Acute kidney failure, unspecified: Secondary | ICD-10-CM | POA: Diagnosis not present

## 2024-01-09 DIAGNOSIS — I5021 Acute systolic (congestive) heart failure: Secondary | ICD-10-CM | POA: Diagnosis not present

## 2024-01-09 DIAGNOSIS — I2102 ST elevation (STEMI) myocardial infarction involving left anterior descending coronary artery: Secondary | ICD-10-CM | POA: Diagnosis not present

## 2024-01-09 DIAGNOSIS — Z4682 Encounter for fitting and adjustment of non-vascular catheter: Secondary | ICD-10-CM | POA: Diagnosis not present

## 2024-01-09 DIAGNOSIS — I255 Ischemic cardiomyopathy: Secondary | ICD-10-CM | POA: Diagnosis not present

## 2024-01-09 DIAGNOSIS — R0989 Other specified symptoms and signs involving the circulatory and respiratory systems: Secondary | ICD-10-CM | POA: Diagnosis not present

## 2024-01-10 DIAGNOSIS — R570 Cardiogenic shock: Secondary | ICD-10-CM | POA: Diagnosis not present

## 2024-01-10 DIAGNOSIS — I4901 Ventricular fibrillation: Secondary | ICD-10-CM | POA: Diagnosis not present

## 2024-01-10 DIAGNOSIS — I5021 Acute systolic (congestive) heart failure: Secondary | ICD-10-CM | POA: Diagnosis not present

## 2024-01-10 DIAGNOSIS — R2232 Localized swelling, mass and lump, left upper limb: Secondary | ICD-10-CM | POA: Diagnosis not present

## 2024-01-10 DIAGNOSIS — I119 Hypertensive heart disease without heart failure: Secondary | ICD-10-CM | POA: Diagnosis not present

## 2024-01-10 DIAGNOSIS — I2102 ST elevation (STEMI) myocardial infarction involving left anterior descending coronary artery: Secondary | ICD-10-CM | POA: Diagnosis not present

## 2024-01-10 DIAGNOSIS — E8721 Acute metabolic acidosis: Secondary | ICD-10-CM | POA: Diagnosis not present

## 2024-01-10 DIAGNOSIS — M6282 Rhabdomyolysis: Secondary | ICD-10-CM | POA: Diagnosis not present

## 2024-01-10 DIAGNOSIS — T79A11A Traumatic compartment syndrome of right upper extremity, initial encounter: Secondary | ICD-10-CM | POA: Diagnosis not present

## 2024-01-10 DIAGNOSIS — R140 Abdominal distension (gaseous): Secondary | ICD-10-CM | POA: Diagnosis not present

## 2024-01-10 DIAGNOSIS — I213 ST elevation (STEMI) myocardial infarction of unspecified site: Secondary | ICD-10-CM | POA: Diagnosis not present

## 2024-01-10 DIAGNOSIS — N170 Acute kidney failure with tubular necrosis: Secondary | ICD-10-CM | POA: Diagnosis not present

## 2024-01-10 DIAGNOSIS — E785 Hyperlipidemia, unspecified: Secondary | ICD-10-CM | POA: Diagnosis not present

## 2024-01-10 DIAGNOSIS — I519 Heart disease, unspecified: Secondary | ICD-10-CM | POA: Diagnosis not present

## 2024-01-10 DIAGNOSIS — R0603 Acute respiratory distress: Secondary | ICD-10-CM | POA: Diagnosis not present

## 2024-01-10 DIAGNOSIS — G9340 Encephalopathy, unspecified: Secondary | ICD-10-CM | POA: Diagnosis not present

## 2024-01-10 DIAGNOSIS — I255 Ischemic cardiomyopathy: Secondary | ICD-10-CM | POA: Diagnosis not present

## 2024-01-10 DIAGNOSIS — Z452 Encounter for adjustment and management of vascular access device: Secondary | ICD-10-CM | POA: Diagnosis not present

## 2024-01-10 DIAGNOSIS — J9601 Acute respiratory failure with hypoxia: Secondary | ICD-10-CM | POA: Diagnosis not present

## 2024-01-10 DIAGNOSIS — I10 Essential (primary) hypertension: Secondary | ICD-10-CM | POA: Diagnosis not present

## 2024-01-10 DIAGNOSIS — M79A12 Nontraumatic compartment syndrome of left upper extremity: Secondary | ICD-10-CM | POA: Diagnosis not present

## 2024-01-10 DIAGNOSIS — I771 Stricture of artery: Secondary | ICD-10-CM | POA: Diagnosis not present

## 2024-01-10 DIAGNOSIS — I469 Cardiac arrest, cause unspecified: Secondary | ICD-10-CM | POA: Diagnosis not present

## 2024-01-11 DIAGNOSIS — I2102 ST elevation (STEMI) myocardial infarction involving left anterior descending coronary artery: Secondary | ICD-10-CM | POA: Diagnosis not present

## 2024-01-11 DIAGNOSIS — Z452 Encounter for adjustment and management of vascular access device: Secondary | ICD-10-CM | POA: Diagnosis not present

## 2024-01-11 DIAGNOSIS — I255 Ischemic cardiomyopathy: Secondary | ICD-10-CM | POA: Diagnosis not present

## 2024-01-11 DIAGNOSIS — N170 Acute kidney failure with tubular necrosis: Secondary | ICD-10-CM | POA: Diagnosis not present

## 2024-01-11 DIAGNOSIS — I4901 Ventricular fibrillation: Secondary | ICD-10-CM | POA: Diagnosis not present

## 2024-01-11 DIAGNOSIS — J9601 Acute respiratory failure with hypoxia: Secondary | ICD-10-CM | POA: Diagnosis not present

## 2024-01-11 DIAGNOSIS — I469 Cardiac arrest, cause unspecified: Secondary | ICD-10-CM | POA: Diagnosis not present

## 2024-01-11 DIAGNOSIS — I5021 Acute systolic (congestive) heart failure: Secondary | ICD-10-CM | POA: Diagnosis not present

## 2024-01-11 DIAGNOSIS — I5189 Other ill-defined heart diseases: Secondary | ICD-10-CM | POA: Diagnosis not present

## 2024-01-11 DIAGNOSIS — R570 Cardiogenic shock: Secondary | ICD-10-CM | POA: Diagnosis not present

## 2024-01-11 DIAGNOSIS — G9340 Encephalopathy, unspecified: Secondary | ICD-10-CM | POA: Diagnosis not present

## 2024-01-11 DIAGNOSIS — Z4682 Encounter for fitting and adjustment of non-vascular catheter: Secondary | ICD-10-CM | POA: Diagnosis not present

## 2024-01-11 DIAGNOSIS — N179 Acute kidney failure, unspecified: Secondary | ICD-10-CM | POA: Diagnosis not present

## 2024-01-11 DIAGNOSIS — I4729 Other ventricular tachycardia: Secondary | ICD-10-CM | POA: Diagnosis not present

## 2024-01-12 DIAGNOSIS — S0990XA Unspecified injury of head, initial encounter: Secondary | ICD-10-CM | POA: Diagnosis not present

## 2024-01-12 DIAGNOSIS — I469 Cardiac arrest, cause unspecified: Secondary | ICD-10-CM | POA: Diagnosis not present

## 2024-01-12 DIAGNOSIS — Z4682 Encounter for fitting and adjustment of non-vascular catheter: Secondary | ICD-10-CM | POA: Diagnosis not present

## 2024-01-12 DIAGNOSIS — N170 Acute kidney failure with tubular necrosis: Secondary | ICD-10-CM | POA: Diagnosis not present

## 2024-01-12 DIAGNOSIS — G9340 Encephalopathy, unspecified: Secondary | ICD-10-CM | POA: Diagnosis not present

## 2024-01-12 DIAGNOSIS — J9601 Acute respiratory failure with hypoxia: Secondary | ICD-10-CM | POA: Diagnosis not present

## 2024-01-12 DIAGNOSIS — I4729 Other ventricular tachycardia: Secondary | ICD-10-CM | POA: Diagnosis not present

## 2024-01-12 DIAGNOSIS — N179 Acute kidney failure, unspecified: Secondary | ICD-10-CM | POA: Diagnosis not present

## 2024-01-12 DIAGNOSIS — R570 Cardiogenic shock: Secondary | ICD-10-CM | POA: Diagnosis not present

## 2024-01-12 DIAGNOSIS — I4901 Ventricular fibrillation: Secondary | ICD-10-CM | POA: Diagnosis not present

## 2024-01-12 DIAGNOSIS — I2102 ST elevation (STEMI) myocardial infarction involving left anterior descending coronary artery: Secondary | ICD-10-CM | POA: Diagnosis not present

## 2024-01-12 DIAGNOSIS — I5021 Acute systolic (congestive) heart failure: Secondary | ICD-10-CM | POA: Diagnosis not present

## 2024-01-13 DIAGNOSIS — E785 Hyperlipidemia, unspecified: Secondary | ICD-10-CM | POA: Diagnosis not present

## 2024-01-13 DIAGNOSIS — Z4682 Encounter for fitting and adjustment of non-vascular catheter: Secondary | ICD-10-CM | POA: Diagnosis not present

## 2024-01-13 DIAGNOSIS — I5020 Unspecified systolic (congestive) heart failure: Secondary | ICD-10-CM | POA: Diagnosis not present

## 2024-01-13 DIAGNOSIS — R570 Cardiogenic shock: Secondary | ICD-10-CM | POA: Diagnosis not present

## 2024-01-13 DIAGNOSIS — N179 Acute kidney failure, unspecified: Secondary | ICD-10-CM | POA: Diagnosis not present

## 2024-01-13 DIAGNOSIS — I4729 Other ventricular tachycardia: Secondary | ICD-10-CM | POA: Diagnosis not present

## 2024-01-13 DIAGNOSIS — M79A12 Nontraumatic compartment syndrome of left upper extremity: Secondary | ICD-10-CM | POA: Diagnosis not present

## 2024-01-13 DIAGNOSIS — N170 Acute kidney failure with tubular necrosis: Secondary | ICD-10-CM | POA: Diagnosis not present

## 2024-01-13 DIAGNOSIS — I2102 ST elevation (STEMI) myocardial infarction involving left anterior descending coronary artery: Secondary | ICD-10-CM | POA: Diagnosis not present

## 2024-01-13 DIAGNOSIS — I5021 Acute systolic (congestive) heart failure: Secondary | ICD-10-CM | POA: Diagnosis not present

## 2024-01-13 DIAGNOSIS — I469 Cardiac arrest, cause unspecified: Secondary | ICD-10-CM | POA: Diagnosis not present

## 2024-01-13 DIAGNOSIS — I110 Hypertensive heart disease with heart failure: Secondary | ICD-10-CM | POA: Diagnosis not present

## 2024-03-16 ENCOUNTER — Telehealth (HOSPITAL_COMMUNITY): Payer: Self-pay | Admitting: *Deleted

## 2024-03-16 NOTE — Telephone Encounter (Signed)
 Called and spoke to [place] regarding how Physical therapy was coming along.  Lane participates consistently 2 x week in outpatient therapy along with working with home exercises given by the PT that sound like both gross motor and fine motor skills.  Physical therapy consist of some aerobic activity along with strength training.  Overall [place] is pleased with his progress. Confirmed his understanding is he will have echo completed on 5/21 and based upon these results, he will have ICD placed.  Presently continues to wear lifevest.  Dealing with acid reflux.  Will check back with pt for update.  Continue to encourage pt.  Alanson Aly, BSN Cardiac and Emergency planning/management officer

## 2024-04-02 ENCOUNTER — Ambulatory Visit (HOSPITAL_BASED_OUTPATIENT_CLINIC_OR_DEPARTMENT_OTHER): Payer: 59 | Admitting: General Surgery

## 2024-07-01 ENCOUNTER — Other Ambulatory Visit: Payer: Self-pay

## 2024-07-01 ENCOUNTER — Emergency Department (HOSPITAL_COMMUNITY)
Admission: EM | Admit: 2024-07-01 | Discharge: 2024-07-01 | Disposition: A | Discharge status: Home / Self Care | Attending: Emergency Medicine | Admitting: Emergency Medicine

## 2024-07-01 ENCOUNTER — Encounter (HOSPITAL_COMMUNITY): Payer: Self-pay

## 2024-07-01 ENCOUNTER — Emergency Department (HOSPITAL_COMMUNITY)

## 2024-07-01 DIAGNOSIS — I5023 Acute on chronic systolic (congestive) heart failure: Secondary | ICD-10-CM | POA: Diagnosis not present

## 2024-07-01 DIAGNOSIS — Z87891 Personal history of nicotine dependence: Secondary | ICD-10-CM | POA: Diagnosis not present

## 2024-07-01 DIAGNOSIS — I110 Hypertensive heart disease with heart failure: Secondary | ICD-10-CM | POA: Insufficient documentation

## 2024-07-01 LAB — COMPREHENSIVE METABOLIC PANEL WITH GFR
ALT: 10 U/L (ref 0–44)
AST: 15 U/L (ref 15–41)
Albumin: 3.9 g/dL (ref 3.5–5.0)
Alkaline Phosphatase: 47 U/L (ref 38–126)
Anion gap: 10 (ref 5–15)
BUN: 20 mg/dL (ref 6–20)
CO2: 28 mmol/L (ref 22–32)
Calcium: 9.9 mg/dL (ref 8.9–10.3)
Chloride: 105 mmol/L (ref 98–111)
Creatinine, Ser: 1.11 mg/dL (ref 0.61–1.24)
GFR, Estimated: >60 mL/min (ref >60)
Glucose, Bld: 99 mg/dL (ref 70–99)
Potassium: 3.8 mmol/L (ref 3.5–5.1)
Sodium: 143 mmol/L (ref 135–145)
Total Bilirubin: 0.8 mg/dL (ref 0.0–1.2)
Total Protein: 6.9 g/dL (ref 6.5–8.1)

## 2024-07-01 LAB — CBC WITH DIFFERENTIAL/PLATELET
Abs Immature Granulocytes: 0.01 K/uL (ref 0.00–0.07)
Basophils Absolute: 0 K/uL (ref 0.0–0.1)
Basophils Relative: 1 %
Eosinophils Absolute: 0.2 K/uL (ref 0.0–0.5)
Eosinophils Relative: 3 %
HCT: 30.3 % — ABNORMAL LOW (ref 39.0–52.0)
Hemoglobin: 9.3 g/dL — ABNORMAL LOW (ref 13.0–17.0)
Immature Granulocytes: 0 %
Lymphocytes Relative: 42 %
Lymphs Abs: 2.5 K/uL (ref 0.7–4.0)
MCH: 29.2 pg (ref 26.0–34.0)
MCHC: 30.7 g/dL (ref 30.0–36.0)
MCV: 95 fL (ref 80.0–100.0)
Monocytes Absolute: 0.8 K/uL (ref 0.1–1.0)
Monocytes Relative: 14 %
Neutro Abs: 2.3 K/uL (ref 1.7–7.7)
Neutrophils Relative %: 40 %
Platelets: 296 K/uL (ref 150–400)
RBC: 3.19 MIL/uL — ABNORMAL LOW (ref 4.22–5.81)
RDW: 18.3 % — ABNORMAL HIGH (ref 11.5–15.5)
WBC: 5.8 K/uL (ref 4.0–10.5)
nRBC: 0 % (ref 0.0–0.2)

## 2024-07-01 LAB — D-DIMER, QUANTITATIVE: D-Dimer, Quant: 1.16 ug{FEU}/mL — ABNORMAL HIGH (ref 0.00–0.50)

## 2024-07-01 LAB — TROPONIN I (HIGH SENSITIVITY)
Troponin I (High Sensitivity): 23 ng/L — ABNORMAL HIGH (ref <18)
Troponin I (High Sensitivity): 26 ng/L — ABNORMAL HIGH (ref <18)

## 2024-07-01 LAB — BRAIN NATRIURETIC PEPTIDE: B Natriuretic Peptide: 2518.5 pg/mL — ABNORMAL HIGH (ref 0.0–100.0)

## 2024-07-01 MED ORDER — IOHEXOL 350 MG/ML SOLN
75.0000 mL | Freq: Once | INTRAVENOUS | Status: AC | PRN
  Administered 2024-07-01: 75 mL via INTRAVENOUS

## 2024-07-01 NOTE — ED Provider Notes (Signed)
 [place] EMERGENCY DEPARTMENT AT [facility] Provider Note  CSN: [id number] Arrival date & time: 07/01/24 1338  Chief Complaint(s) Shortness of Breath  HPI James Diaz is a 53 y.o. 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  Patient reports that overall he has been doing quite well.  He has been compliant with his medications.  He reports that yesterday he was feeling okay, last night he developed increased shortness of breath, orthopnea.  Says his home oxygen level was lower.  Currently feels much better.  Denies any chest pain, back pain, abdominal pain.  No cough, fevers or chills.  No nausea or vomiting.  No leg swelling.  No ICD shocks.   Past Medical History Past Medical History:  Diagnosis Date   Acid reflux    Anxiety    Depression    Hypertension    Multiple allergies    Other and unspecified hyperlipidemia    There are no active problems to display for this patient.  Home Medication(s) Prior to Admission medications   Medication Sig Start Date End Date Taking? Authorizing Provider  cyclobenzaprine  (FLEXERIL ) 10 MG tablet Take 1 tablet (10 mg total) by mouth at bedtime as needed for muscle spasms. 08/29/19   Jarold Medici, MD  dexlansoprazole (DEXILANT) 60 MG capsule Take 60 mg by mouth daily.      [provider]  DULoxetine (CYMBALTA) 30 MG capsule Take by mouth. 10/19/19   [provider]  gabapentin  (NEURONTIN ) 300 MG capsule TAKE 2 CAPSULES BY MOUTH EVERY MORNING AND AFTERNOON AND 3 CAPSULES EACH EVENING 09/25/19   [provider]  nortriptyline (PAMELOR) 10 MG capsule  06/18/19   [provider]  olmesartan  (BENICAR ) 40 MG tablet TAKE 1 TABLET BY MOUTH EVERY DAY 07/14/20   Jarold Medici, MD   propranolol (INNOPRAN XL) 120 MG 24 hr capsule Take by mouth. 11/12/19 11/11/20  [provider]                                                                                                                                    Past Surgical History Past Surgical History:  Procedure Laterality Date   ANTERIOR FUSION CERVICAL SPINE  07/13/2018   approximate   POSTERIOR FUSION CERVICAL SPINE  12/14/2017   [facility]   SHOULDER ARTHROSCOPY WITH ROTATOR CUFF REPAIR AND SUBACROMIAL DECOMPRESSION Right 11/19/2013   Procedure: RIGHT SHOULDER SCOPE, ROTATOR CUFF DEBRIDEMENT VERSUS REPAIR, DISTAL CLAVICLE EXCISION, SUB ACROMIAL DECOMPRESSION;  Surgeon: Eva Elsie Herring, MD;  Location: [facility];  Service: Orthopedics;  Laterality: Right;  repair   TIBIA FRACTURE SURGERY     Family History Family History  Problem Relation Age of Onset   Heart Problems Mother    Hypertension Mother  Other Mother        kidney problem   Healthy Father     Social History Social History   Tobacco Use   Smoking status: Former    Current packs/day: 0.00    Average packs/day: 0.3 packs/day for 2.0 years (0.5 ttl pk-yrs)    Types: Cigarettes    Start date: 11/30/2005    Quit date: 12/01/2007    Years since quitting: 16.5   Smokeless tobacco: Never  Vaping Use   Vaping status: Never Used  Substance Use Topics   Alcohol use: Yes    Comment: weekends   Drug use: No   Allergies Patient has no known allergies.  Review of Systems Review of Systems  All other systems reviewed and are negative.   Physical Exam Vital Signs  I have reviewed the triage vital signs BP 106/79 (BP Location: Right Arm)   Pulse 75   Temp 98.1 F (36.7 C) (Oral)   Resp 13   SpO2 90%  Physical Exam Vitals and nursing note reviewed.  Constitutional:      General: He is not in acute distress.    Appearance: Normal appearance.  HENT:     Mouth/Throat:     Mouth: Mucous membranes are  moist.  Eyes:     Conjunctiva/sclera: Conjunctivae normal.  Neck:     Vascular: No JVD.  Cardiovascular:     Rate and Rhythm: Normal rate and regular rhythm.  Pulmonary:     Effort: Pulmonary effort is normal. No respiratory distress.     Breath sounds: Normal breath sounds.  Abdominal:     General: Abdomen is flat.     Palpations: Abdomen is soft.     Tenderness: There is no abdominal tenderness.  Musculoskeletal:     Right lower leg: No edema.     Left lower leg: No edema.  Skin:    General: Skin is warm and dry.     Capillary Refill: Capillary refill takes less than 2 seconds.     Comments: Left upper chest ICD site c/d/I with steri strips. No erythema  Neurological:     Mental Status: He is alert and oriented to person, place, and time. Mental status is at baseline.  Psychiatric:        Mood and Affect: Mood normal.        Behavior: Behavior normal.     ED Results and Treatments Labs (all labs ordered are listed, but only abnormal results are displayed) Labs Reviewed  CBC WITH DIFFERENTIAL/PLATELET - Abnormal; Notable for the following components:      Result Value   RBC 3.19 (*)    Hemoglobin 9.3 (*)    HCT 30.3 (*)    RDW 18.3 (*)    All other components within normal limits  BRAIN NATRIURETIC PEPTIDE - Abnormal; Notable for the following components:   B Natriuretic Peptide 2,518.5 (*)    All other components within normal limits  D-DIMER, QUANTITATIVE - Abnormal; Notable for the following components:   D-Dimer, Quant 1.16 (*)    All other components within normal limits  TROPONIN I (HIGH SENSITIVITY) - Abnormal; Notable for the following components:   Troponin I (High Sensitivity) 23 (*)    All other components within normal limits  TROPONIN I (HIGH SENSITIVITY) - Abnormal; Notable for the following components:   Troponin I (High Sensitivity) 26 (*)    All other components within normal limits  COMPREHENSIVE METABOLIC PANEL WITH GFR  Radiology CT Angio Chest PE W and/or Wo Contrast Result Date: 07/01/2024 CLINICAL DATA:  Pulmonary embolism (PE) suspected, low to intermediate prob, positive D-dimer Shortness of breath. EXAM: CT ANGIOGRAPHY CHEST WITH CONTRAST TECHNIQUE: Multidetector CT imaging of the chest was performed using the standard protocol during bolus administration of intravenous contrast. Multiplanar CT image reconstructions and MIPs were obtained to evaluate the vascular anatomy. RADIATION DOSE REDUCTION: This exam was performed according to the departmental dose-optimization program which includes automated exposure control, adjustment of the mA and/or kV according to patient size and/or use of iterative reconstruction technique. CONTRAST:  75mL OMNIPAQUE  IOHEXOL  350 MG/ML SOLN COMPARISON:  Radiograph earlier today. FINDINGS: Cardiovascular: Streak artifact causes obscuration of the left upper lobe pulmonary arteries. Additionally there is mild patient motion artifact. Allowing for these limitations, no evidence of pulmonary embolus. Left-sided pacemaker lead tip in the right ventricle. No pericardial effusion. The heart is enlarged. Minimal contrast refluxing into the IVC. Coronary artery calcifications or stents. Mild aortic atherosclerosis. No evidence of acute aortic finding allowing for phase of contrast not optimized to aortic assessment. Mediastinum/Nodes: Shotty mediastinal and hilar lymph nodes, likely reactive. Unremarkable appearance of the esophagus. No visible thyroid  nodule. Lungs/Pleura: Small right and trace left pleural effusion. Ground-glass opacities with septal thickening typical of pulmonary edema. There is also central bronchial thickening that may be bronchitic or congestive. No pneumothorax. Upper Abdomen: No acute findings. Musculoskeletal: There are no acute or suspicious osseous abnormalities. Mild body wall  edema. Review of the MIP images confirms the above findings. IMPRESSION: 1. No evidence of pulmonary embolus. 2. Cardiomegaly with pulmonary edema. Small right and trace left pleural effusions. 3. Central bronchial thickening may be bronchitic or congestive. 4. Shotty mediastinal and hilar lymph nodes, likely reactive. Aortic Atherosclerosis (ICD10-I70.0). Electronically Signed   By: Andrea Gasman M.D.   On: 07/01/2024 17:22   DG Chest Port 1 View Result Date: 07/01/2024 CLINICAL DATA:  Chest pain.  Shortness of breath. EXAM: PORTABLE CHEST 1 VIEW COMPARISON:  None Available. FINDINGS: Left-sided pacemaker in place. The heart is borderline enlarged. Mediastinal contours are normal. Small right pleural effusion with basilar atelectasis. No pulmonary edema or pneumothorax. Surgical hardware in the lower cervical spine. IMPRESSION: Small right pleural effusion with basilar atelectasis. Electronically Signed   By: Andrea Gasman M.D.   On: 07/01/2024 15:00    Pertinent labs & imaging results that were available during my care of the patient were reviewed by me and considered in my medical decision making (see MDM for details).  Medications Ordered in ED Medications  iohexol  (OMNIPAQUE ) 350 MG/ML injection 75 mL (75 mLs Intravenous Contrast Given 07/01/24 1615)                                                                                                                                     Procedures Procedures  (including critical care time)  Medical Decision Making / ED Course  MDM:  53 year old presenting to the emergency department with shortness of breath.  Patient overall well-appearing, physical examination with no focal abnormality, healing ICD site which appears to be healing well.  Appears euvolemic.   Unclear cause of shortness of breath.  Symptoms have overall resolved.  Considered CHF however he appears relatively euvolemic.  Will obtain BNP and chest x-ray.  Given recent  surgical procedure will check D-dimer although lower concern for PE without tachypnea, chest pain, hypoxia.  Considered ACS, no chest pain will check troponin.  Will obtain chest x-ray to evaluate for pneumothorax, pneumonia, ICD malposition.  Will reassess.  He reports his symptoms have overall resolved.  If he continues to feel well and his workup is reassuring given his reassuring exam and vital signs I think he would be stable for outpatient follow-up with his cardiologist  Clinical Course as of 07/01/24 1833  Sun Jul 01, 2024  1829 Workup overall reassuring.  D-dimer was elevated so CTA chest was obtained.  This has some findings of mild pulmonary edema.  Suspect patient's symptoms are likely due to mild volume overload.  He reports that he had not taken any Bumex from Monday until this morning.  After he took a dose of Bumex this morning he reports that shortness of breath got a lot better.  Has been urinating well.  He was prescribed this as needed.  Recommended that he take this daily for the next 4 to 5 days and call his cardiologist.  May need to be on a more constant dose given his rapid fluid accumulation.  Offered admission for further diuresis but patient reports currently has no shortness of breath, understands need to take Bumex, think it is reasonable for patient to go home at this time.  Troponin minimally elevated likely in the setting of CHF, stable on recheck. Will discharge patient to home. All questions answered. Patient comfortable with plan of discharge. Return precautions discussed with patient and specified on the after visit summary.  [WS]    Clinical Course User Index [WS] Francesca Elsie CROME, MD     Additional history obtained:  -External records from outside source obtained and reviewed including: Chart review including previous notes, labs, imaging, consultation notes including [facility]    Lab Tests: -I ordered, reviewed, and interpreted labs.   The pertinent  results include:   Labs Reviewed  CBC WITH DIFFERENTIAL/PLATELET - Abnormal; Notable for the following components:      Result Value   RBC 3.19 (*)    Hemoglobin 9.3 (*)    HCT 30.3 (*)    RDW 18.3 (*)    All other components within normal limits  BRAIN NATRIURETIC PEPTIDE - Abnormal; Notable for the following components:   B Natriuretic Peptide 2,518.5 (*)    All other components within normal limits  D-DIMER, QUANTITATIVE - Abnormal; Notable for the following components:   D-Dimer, Quant 1.16 (*)    All other components within normal limits  TROPONIN I (HIGH SENSITIVITY) - Abnormal; Notable for the following components:   Troponin I (High Sensitivity) 23 (*)    All other components within normal limits  TROPONIN I (HIGH SENSITIVITY) - Abnormal; Notable for the following components:   Troponin I (High Sensitivity) 26 (*)    All other components within normal limits  COMPREHENSIVE METABOLIC PANEL WITH GFR    Notable for elevated BNP, elevated d-dimer  EKG  NSR, PRWP. No STEMI    Imaging Studies ordered: I ordered imaging studies including CXR,  CT chest On my interpretation imaging demonstrates no PE  I independently visualized and interpreted imaging. I agree with the radiologist interpretation   Medicines ordered and prescription drug management: Meds ordered this encounter  Medications   iohexol  (OMNIPAQUE ) 350 MG/ML injection 75 mL    -I have reviewed the patients home medicines and have made adjustments as needed   Cardiac Monitoring: The patient was maintained on a cardiac monitor.  I personally viewed and interpreted the cardiac monitored which showed an underlying rhythm of: NSR Reevaluation: After the interventions noted above, I reevaluated the patient and found that their symptoms have improved  Co morbidities that complicate the patient evaluation  Past Medical History:  Diagnosis Date   Acid reflux    Anxiety    Depression    Hypertension     Multiple allergies    Other and unspecified hyperlipidemia       Dispostion: Disposition decision including need for hospitalization was considered, and patient discharged from emergency department.    Final Clinical Impression(s) / ED Diagnoses Final diagnoses:  Acute on chronic systolic congestive heart failure (HCC)     This chart was dictated using voice recognition software.  Despite best efforts to proofread,  errors can occur which can change the documentation meaning.    Francesca Elsie CROME, MD 07/01/24 [phone]

## 2024-07-01 NOTE — ED Triage Notes (Signed)
 Pt has c/o shortness of breath since last night. Pt had a defibrillator placed last Thursday and has been taking Bumetanide as needed. Denies any chest pain, and states his O2 reading at home was in the 80s.

## 2024-07-01 NOTE — Discharge Instructions (Signed)
 We evaluated you for your shortness of breath.  Your symptoms had improved by the time you came to the emergency department.  Your testing was overall reassuring.  It suggest that your symptoms are due to heart failure.  Since your symptoms have gotten so much better after taking bumetanide this morning, and you do not feel any more shortness of breath, we feel that is safe to go home.  Please follow-up closely with your cardiologist.  Please start taking bumetanide daily for the next 4 to 5 days to ensure no more fluid buildup occurs.  If you have any new or worsening symptoms such as recurrent or worsening shortness of breath, chest pain, difficulty breathing, lightheadedness or dizziness, fevers or chills, cough, or any other new symptoms, please return to the emergency department.
